# Patient Record
Sex: Male | Born: 1960 | Race: White | Hispanic: No | Marital: Married | Smoking: Never smoker
Health system: Southern US, Community
[De-identification: ages and names within clinical notes are randomized; demographics above are authoritative.]

## PROBLEM LIST (undated history)

## (undated) DIAGNOSIS — N200 Calculus of kidney: Secondary | ICD-10-CM

## (undated) DIAGNOSIS — E785 Hyperlipidemia, unspecified: Secondary | ICD-10-CM

## (undated) DIAGNOSIS — E119 Type 2 diabetes mellitus without complications: Secondary | ICD-10-CM

## (undated) HISTORY — PX: CHOLECYSTECTOMY: SHX55

## (undated) HISTORY — PX: LITHOTRIPSY: SUR834

## (undated) HISTORY — PX: NECK SURGERY: SHX720

---

## 2000-11-12 ENCOUNTER — Encounter: Payer: Self-pay | Admitting: Orthopedic Surgery

## 2000-11-12 ENCOUNTER — Ambulatory Visit (HOSPITAL_COMMUNITY): Admission: RE | Admit: 2000-11-12 | Discharge: 2000-11-12 | Payer: Self-pay | Admitting: Orthopedic Surgery

## 2000-11-26 ENCOUNTER — Encounter: Payer: Self-pay | Admitting: Orthopedic Surgery

## 2000-11-26 ENCOUNTER — Ambulatory Visit (HOSPITAL_COMMUNITY): Admission: RE | Admit: 2000-11-26 | Discharge: 2000-11-26 | Payer: Self-pay | Admitting: Orthopedic Surgery

## 2000-12-10 ENCOUNTER — Encounter: Payer: Self-pay | Admitting: Orthopedic Surgery

## 2000-12-10 ENCOUNTER — Ambulatory Visit (HOSPITAL_COMMUNITY): Admission: RE | Admit: 2000-12-10 | Discharge: 2000-12-10 | Payer: Self-pay | Admitting: Orthopedic Surgery

## 2010-11-30 ENCOUNTER — Other Ambulatory Visit: Payer: Self-pay | Admitting: Internal Medicine

## 2010-11-30 DIAGNOSIS — R109 Unspecified abdominal pain: Secondary | ICD-10-CM

## 2012-10-23 ENCOUNTER — Ambulatory Visit
Admission: RE | Admit: 2012-10-23 | Discharge: 2012-10-23 | Disposition: A | Discharge status: Home / Self Care | Payer: 59 | Source: Ambulatory Visit | Attending: Geriatric Medicine | Admitting: Geriatric Medicine

## 2012-10-23 ENCOUNTER — Other Ambulatory Visit: Payer: Self-pay | Admitting: Geriatric Medicine

## 2012-10-23 DIAGNOSIS — R109 Unspecified abdominal pain: Secondary | ICD-10-CM

## 2014-06-05 ENCOUNTER — Emergency Department (HOSPITAL_COMMUNITY): Payer: 59

## 2014-06-05 ENCOUNTER — Emergency Department (HOSPITAL_COMMUNITY)
Admission: EM | Admit: 2014-06-05 | Discharge: 2014-06-06 | Disposition: A | Discharge status: Home / Self Care | Payer: 59 | Attending: Emergency Medicine | Admitting: Emergency Medicine

## 2014-06-05 ENCOUNTER — Encounter (HOSPITAL_COMMUNITY): Payer: Self-pay | Admitting: Emergency Medicine

## 2014-06-05 DIAGNOSIS — N1330 Unspecified hydronephrosis: Secondary | ICD-10-CM | POA: Insufficient documentation

## 2014-06-05 DIAGNOSIS — R109 Unspecified abdominal pain: Secondary | ICD-10-CM | POA: Diagnosis present

## 2014-06-05 DIAGNOSIS — N200 Calculus of kidney: Secondary | ICD-10-CM | POA: Insufficient documentation

## 2014-06-05 DIAGNOSIS — F17200 Nicotine dependence, unspecified, uncomplicated: Secondary | ICD-10-CM | POA: Insufficient documentation

## 2014-06-05 DIAGNOSIS — E119 Type 2 diabetes mellitus without complications: Secondary | ICD-10-CM | POA: Insufficient documentation

## 2014-06-05 DIAGNOSIS — Z880 Allergy status to penicillin: Secondary | ICD-10-CM | POA: Insufficient documentation

## 2014-06-05 HISTORY — DX: Type 2 diabetes mellitus without complications: E11.9

## 2014-06-05 HISTORY — DX: Calculus of kidney: N20.0

## 2014-06-05 LAB — BASIC METABOLIC PANEL
Anion gap: 19 — ABNORMAL HIGH (ref 5–15)
BUN: 17 mg/dL (ref 6–23)
CO2: 16 mEq/L — ABNORMAL LOW (ref 19–32)
CREATININE: 1.52 mg/dL — AB (ref 0.50–1.35)
Calcium: 8.9 mg/dL (ref 8.4–10.5)
Chloride: 104 mEq/L (ref 96–112)
GFR calc Af Amer: 59 mL/min — ABNORMAL LOW (ref >90)
GFR calc non Af Amer: 51 mL/min — ABNORMAL LOW (ref >90)
GLUCOSE: 294 mg/dL — AB (ref 70–99)
POTASSIUM: 4.3 meq/L (ref 3.7–5.3)
Sodium: 139 mEq/L (ref 137–147)

## 2014-06-05 LAB — URINE MICROSCOPIC-ADD ON

## 2014-06-05 LAB — CBC WITH DIFFERENTIAL/PLATELET
Basophils Absolute: 0 10*3/uL (ref 0.0–0.1)
Basophils Relative: 0 % (ref 0–1)
EOS ABS: 0.1 10*3/uL (ref 0.0–0.7)
EOS PCT: 1 % (ref 0–5)
HCT: 41.4 % (ref 39.0–52.0)
HEMOGLOBIN: 14 g/dL (ref 13.0–17.0)
Lymphocytes Relative: 22 % (ref 12–46)
Lymphs Abs: 1.8 10*3/uL (ref 0.7–4.0)
MCH: 29.4 pg (ref 26.0–34.0)
MCHC: 33.8 g/dL (ref 30.0–36.0)
MCV: 86.8 fL (ref 78.0–100.0)
MONO ABS: 0.7 10*3/uL (ref 0.1–1.0)
MONOS PCT: 8 % (ref 3–12)
NEUTROS PCT: 69 % (ref 43–77)
Neutro Abs: 5.7 10*3/uL (ref 1.7–7.7)
Platelets: 217 10*3/uL (ref 150–400)
RBC: 4.77 MIL/uL (ref 4.22–5.81)
RDW: 13 % (ref 11.5–15.5)
WBC: 8.3 10*3/uL (ref 4.0–10.5)

## 2014-06-05 LAB — URINALYSIS, ROUTINE W REFLEX MICROSCOPIC
Bilirubin Urine: NEGATIVE
Glucose, UA: >1000 mg/dL — AB
Ketones, ur: NEGATIVE mg/dL
Leukocytes, UA: NEGATIVE
Nitrite: NEGATIVE
Protein, ur: NEGATIVE mg/dL
Specific Gravity, Urine: 1.027 (ref 1.005–1.030)
Urobilinogen, UA: 0.2 mg/dL (ref 0.0–1.0)
pH: 5 (ref 5.0–8.0)

## 2014-06-05 MED ORDER — ONDANSETRON HCL 4 MG/2ML IJ SOLN
4.0000 mg | Freq: Once | INTRAMUSCULAR | Status: AC
Start: 2014-06-05 — End: 2014-06-05
  Administered 2014-06-05: 4 mg via INTRAVENOUS
  Filled 2014-06-05: qty 2

## 2014-06-05 MED ORDER — HYDROMORPHONE HCL PF 1 MG/ML IJ SOLN
1.0000 mg | Freq: Once | INTRAMUSCULAR | Status: AC
  Administered 2014-06-05: 1 mg via INTRAVENOUS
  Filled 2014-06-05: qty 1

## 2014-06-05 MED ORDER — MORPHINE SULFATE 4 MG/ML IJ SOLN
4.0000 mg | Freq: Once | INTRAMUSCULAR | Status: AC
  Administered 2014-06-05: 4 mg via INTRAVENOUS
  Filled 2014-06-05: qty 1

## 2014-06-05 MED ORDER — HYDROMORPHONE HCL PF 1 MG/ML IJ SOLN: 1.0000 mg | Freq: Once | INTRAMUSCULAR | Status: DC

## 2014-06-05 MED ORDER — SODIUM CHLORIDE 0.9 % IV BOLUS (SEPSIS)
1000.0000 mL | Freq: Once | INTRAVENOUS | Status: AC
  Administered 2014-06-05: 1000 mL via INTRAVENOUS

## 2014-06-05 MED ORDER — KETOROLAC TROMETHAMINE 30 MG/ML IJ SOLN
30.0000 mg | Freq: Once | INTRAMUSCULAR | Status: AC
  Administered 2014-06-05: 30 mg via INTRAVENOUS
  Filled 2014-06-05: qty 1

## 2014-06-05 MED ORDER — PROMETHAZINE HCL 25 MG/ML IJ SOLN
25.0000 mg | Freq: Once | INTRAMUSCULAR | Status: AC
  Administered 2014-06-05: 25 mg via INTRAVENOUS
  Filled 2014-06-05: qty 1

## 2014-06-05 NOTE — ED Notes (Signed)
Patient presents to ED via [facility]. Patient states that he started having left sided flank pain a couple hours ago with associated nausea and vomiting. Patient was attempting to drive to the hospital but the pain became too severe and he flagged down an officer/ems. EMS administered of Fentanyl. VSS. Hx of kidney stones. A&Ox4. No acute distress noted at this time. 18 LAC inserted by EMS.

## 2014-06-05 NOTE — ED Provider Notes (Signed)
CSN: [id number]     Arrival date & time 06/05/14  2100 History   First MD Initiated Contact with Patient 06/05/14 2103     Chief Complaint  Patient presents with  . Flank Pain     (Consider location/radiation/quality/duration/timing/severity/associated sxs/prior Treatment) HPI Comments: Patient is a 53 year old male past history diabetes, renal calculi presenting today with the chief complaint of left flank pain for several hours.  The patient reports abrupt onset, nonradiating pain. He reports associated nausea with vomiting.  Denies diarrhea. Denies history of diverticulitis. Denies hematuria or dysuria. Patient reports 2 lithotripsies in the past and oriented. Is not followed by urologist at this time. PCP: Polite  Patient is a 53 y.o. male presenting with flank pain. The history is provided by the patient. No language interpreter was used.  Flank Pain Associated symptoms include nausea and vomiting. Pertinent negatives include no chills or fever.    Past Medical History  Diagnosis Date  . Diabetes mellitus without complication   . Kidney stones    Past Surgical History  Procedure Laterality Date  . Neck surgery    . Cholecystectomy    . Lithotripsy     No family history on file. History  Substance Use Topics  . Smoking status: Current Some Day Smoker  . Smokeless tobacco: Not on file  . Alcohol Use: Yes    Review of Systems  Constitutional: Negative for fever and chills.  Gastrointestinal: Positive for nausea and vomiting. Negative for diarrhea.  Genitourinary: Positive for flank pain. Negative for dysuria and hematuria.      Allergies  Penicillins  Home Medications   Prior to Admission medications   Not on File   BP 152/96  Pulse 83  Temp(Src) 97.9 F (36.6 C) (Oral)  Resp 20  SpO2 95% Physical Exam  Nursing note and vitals reviewed. Constitutional: He is oriented to person, place, and time. He appears well-developed and well-nourished.  Appears  uncomfortable  HENT:  Head: Normocephalic and atraumatic.  Eyes: EOM are normal. Pupils are equal, round, and reactive to light. No scleral icterus.  Neck: Neck supple.  Cardiovascular: Normal rate.   Pulmonary/Chest: Effort normal and breath sounds normal. No respiratory distress. He has no wheezes.  Abdominal: Soft. He exhibits no distension. There is no tenderness. There is CVA tenderness. There is no rebound.  Mild-moderate CVA tenderness.  Musculoskeletal: Normal range of motion.  Neurological: He is alert and oriented to person, place, and time.  Skin: Skin is warm. He is diaphoretic.  Psychiatric: He has a normal mood and affect. His behavior is normal.    ED Course  Procedures (including critical care time) Labs Review Results for orders placed during the hospital encounter of 06/05/14  URINALYSIS, ROUTINE W REFLEX MICROSCOPIC      Result Value Ref Range   Color, Urine YELLOW  YELLOW   APPearance TURBID (*) CLEAR   Specific Gravity, Urine 1.027  1.005 - 1.030   pH 5.0  5.0 - 8.0   Glucose, UA >1000 (*) NEGATIVE mg/dL   Hgb urine dipstick MODERATE (*) NEGATIVE   Bilirubin Urine NEGATIVE  NEGATIVE   Ketones, ur NEGATIVE  NEGATIVE mg/dL   Protein, ur NEGATIVE  NEGATIVE mg/dL   Urobilinogen, UA 0.2  0.0 - 1.0 mg/dL   Nitrite NEGATIVE  NEGATIVE   Leukocytes, UA NEGATIVE  NEGATIVE  CBC WITH DIFFERENTIAL      Result Value Ref Range   WBC 8.3  4.0 - 10.5 K/uL   RBC 4.77  4.22 - 5.81 MIL/uL   Hemoglobin 14.0  13.0 - 17.0 g/dL   HCT 69.6  29.5 - 28.4 %   MCV 86.8  78.0 - 100.0 fL   MCH 29.4  26.0 - 34.0 pg   MCHC 33.8  30.0 - 36.0 g/dL   RDW 13.2  44.0 - 10.2 %   Platelets 217  150 - 400 K/uL   Neutrophils Relative % 69  43 - 77 %   Neutro Abs 5.7  1.7 - 7.7 K/uL   Lymphocytes Relative 22  12 - 46 %   Lymphs Abs 1.8  0.7 - 4.0 K/uL   Monocytes Relative 8  3 - 12 %   Monocytes Absolute 0.7  0.1 - 1.0 K/uL   Eosinophils Relative 1  0 - 5 %   Eosinophils Absolute 0.1   0.0 - 0.7 K/uL   Basophils Relative 0  0 - 1 %   Basophils Absolute 0.0  0.0 - 0.1 K/uL  BASIC METABOLIC PANEL      Result Value Ref Range   Sodium 139  137 - 147 mEq/L   Potassium 4.3  3.7 - 5.3 mEq/L   Chloride 104  96 - 112 mEq/L   CO2 16 (*) 19 - 32 mEq/L   Glucose, Bld 294 (*) 70 - 99 mg/dL   BUN 17  6 - 23 mg/dL   Creatinine, Ser 7.25 (*) 0.50 - 1.35 mg/dL   Calcium 8.9  8.4 - 36.6 mg/dL   GFR calc non Af Amer 51 (*) >90 mL/min   GFR calc Af Amer 59 (*) >90 mL/min   Anion gap 19 (*) 5 - 15  URINE MICROSCOPIC-ADD ON      Result Value Ref Range   RBC / HPF 3-6  <3 RBC/hpf   Ct Renal Stone Study  06/05/2014   CLINICAL DATA:  Left flank pain began earlier this evening with associated nausea and vomiting  EXAM: CT RENAL STONE PROTOCOL  TECHNIQUE: Multidetector CT imaging of the abdomen and pelvis was performed following the standard protocol without intravenous contrast  COMPARISON:  None.  FINDINGS: Mild bilateral dependent atelectasis.  Liver is normal. Gallbladder is normal. Spleen is normal. Pancreas is normal. Adrenal glands are normal.  6 mm stone midpole right kidney. 1 mm stone lower pole right kidney. Right kidney otherwise normal.  4 mm stone midpole left kidney. 1 mm stone lower pole left kidney. Just beyond the ureterovesical junction in the proximal left ureter, there is an 8 mm stone. There is moderate proximal hydronephrosis and perinephric inflammatory change.  Bladder is normal. Reproductive organs are normal. There is moderate diverticulosis of the descending colon and of the proximal sigmoid colon. There is mild diverticulosis of the proximal colon. Large bowel is otherwise normal. Appendix is normal. Small bowel is normal. Stomach is normal.  There is no ascites. There is no significant adenopathy. There are no acute musculoskeletal findings.  IMPRESSION: Moderately severe hydronephrosis due to 8 mm stone proximal left ureter just beyond the ureteropelvic junction    Electronically Signed   By: Esperanza Heir M.D.   On: 06/05/2014 22:42     Imaging Review No results found.   EKG Interpretation None      MDM   Final diagnoses:  Renal calculi  Hydronephrosis of left kidney   Patient presents with left flank pain, history of renal calculi. Likely stone. Nausea meds, pain meds, labs, CT ordered. Creatinine elevated 1.52, fluids given. CT shows 8 mm  stone proximal ureter with moderate to severe hydronephrosis. 2305 Re-eval patient and persistent pain, continues to appear uncomfortable with active vomiting in ED. Plan to consult urology for further recommendations. Discussed patient history, condition with Dr. Laverle PatterBorden, advises Toradol, if symptoms resolve followup outpatient.  0025 reevaluation patient sleeping in room. Denies further emesis or pain at this time. States it feels as though pain to be trouble controlled at home.  Advised patient to followup with urology next week for further treatment of stone. Discussed lab results, imaging results, and treatment plan with the patient. Return precautions given. Reports understanding and no other concerns at this time.  Patient is stable for discharge at this time.  Meds given in ED:  Medications  HYDROmorphone (DILAUDID) injection 1 mg (not administered)  promethazine (PHENERGAN) injection 25 mg (not administered)  sodium chloride 0.9 % bolus 1,000 mL (0 mLs Intravenous Stopped 06/05/14 2257)  ondansetron (ZOFRAN) injection 4 mg (4 mg Intravenous Given 06/05/14 2118)  morphine 4 MG/ML injection 4 mg (4 mg Intravenous Given 06/05/14 2119)  HYDROmorphone (DILAUDID) injection 1 mg (1 mg Intravenous Given 06/05/14 2152)  HYDROmorphone (DILAUDID) injection 1 mg (1 mg Intravenous Given 06/05/14 2256)    Discharge Medication List as of 06/06/2014 12:31 AM    START taking these medications   Details  ondansetron (ZOFRAN) 4 MG tablet Take 1 tablet (4 mg total) by mouth every 6 (six) hours., Starting 06/06/2014, Until  Discontinued, Print    oxyCODONE-acetaminophen (PERCOCET/ROXICET) 5-325 MG per tablet Take 1-2 tablets by mouth every 4 (four) hours as needed for moderate pain or severe pain., Starting 06/06/2014, Until Discontinued, Print    promethazine (PHENERGAN) 25 MG suppository Place 1 suppository (25 mg total) rectally every 6 (six) hours as needed for nausea or vomiting., Starting 06/06/2014, Until Discontinued, Print    tamsulosin (FLOMAX) 0.4 MG CAPS capsule Take 1 capsule (0.4 mg total) by mouth 1 day or 1 dose., Starting 06/06/2014, Until Discontinued, Print          Mellody DrownLauren Kelechi Orgeron, PA-C 06/06/14 0124

## 2014-06-06 MED ORDER — PROMETHAZINE HCL 25 MG RE SUPP: 25.0000 mg | Freq: Four times a day (QID) | RECTAL | Status: DC | PRN

## 2014-06-06 MED ORDER — OXYCODONE-ACETAMINOPHEN 5-325 MG PO TABS: 1.0000 | ORAL_TABLET | ORAL | Status: DC | PRN

## 2014-06-06 MED ORDER — ONDANSETRON HCL 4 MG PO TABS: 4.0000 mg | ORAL_TABLET | Freq: Four times a day (QID) | ORAL | Status: DC

## 2014-06-06 MED ORDER — TAMSULOSIN HCL 0.4 MG PO CAPS: 0.4000 mg | ORAL_CAPSULE | ORAL | Status: DC

## 2014-06-06 NOTE — Discharge Instructions (Signed)
Call a urologist for further evaluation and management of your kidney stone. Call for a follow up appointment with a Family or Primary Care Provider.  Return if Symptoms worsen.   Take medication as prescribed.  Drink plenty of fluids.

## 2014-06-06 NOTE — ED Provider Notes (Signed)
Medical screening examination/treatment/procedure(s) were conducted as a shared visit with non-physician practitioner(s) and myself.  I personally evaluated the patient during the encounter.   EKG Interpretation None      I interviewed and examined the patient. Lungs are CTAB. Cardiac exam wnl. Abdomen soft.  Pt uncomfortable on my exam. Found to have 8mm stone. Initially had trouble controlling pain. On call URO recommended toradol. Pt's pain controlled. Will be d/c home and f/u w/ URO. Return for any worsening.   Logan SheffieldForrest Audria Takeshita, MD 06/06/14 1222

## 2014-06-08 ENCOUNTER — Other Ambulatory Visit: Payer: Self-pay | Admitting: Urology

## 2014-06-08 ENCOUNTER — Encounter (HOSPITAL_COMMUNITY): Payer: Self-pay | Admitting: General Practice

## 2014-06-09 ENCOUNTER — Encounter (HOSPITAL_COMMUNITY): Payer: Self-pay | Admitting: Pharmacy Technician

## 2014-06-09 NOTE — H&P (Signed)
Active Problems Problems  1. Increased urinary frequency (788.41) 2. Nephrolithiasis (592.0)  History of Present Illness Logan Gutierrez is a 53 yo former patient of Dr. Sherron Monday with a history of stones who had the onset Saturday of severe left flank pain with nausea and vomiting. He was seen in the ER where he was found to have an 8mm left proximal stone and bilateral renal stones. He has been on oxycodone q6hrs with some pain relief. He has no voiding complaints. He has had no hematuria. He has had 2 prior ESWL's [place].   Past Medical History Problems  1. History of diabetes mellitus (V12.29) 2. History of hypercholesterolemia (V12.29) 3. History of hypothyroidism (V12.29) 4. History of kidney stones (V13.01) 5. History of low back pain (V13.59) 6. History of Overweight (278.02)  Surgical History Problems  1. History of Cholecystectomy 2. History of Lithotripsy 3. History of Lithotripsy 4. History of Spinal Diskectomy Cervical  Current Meds 1. Adult Aspirin Low Strength 81 MG TBDP;  Therapy: (Recorded:15Feb2012) to Recorded 2. Crestor 5 MG Oral Tablet;  Therapy: (Recorded:10Aug2015) to Recorded 3. FreeStyle Lite Test In [employer];  Therapy: 17Jul2012 to Recorded 4. HumaLOG KwikPen 100 UNIT/ML SOLN;  Therapy: 29May2012 to Recorded 5. Invokana 100 MG Oral Tablet;  Therapy: (Recorded:10Aug2015) to Recorded 6. Ondansetron HCl - 4 MG Oral Tablet;  Therapy: (Recorded:10Aug2015) to Recorded 7. Oxycodone-Acetaminophen 5-325 MG Oral Tablet;  Therapy: (Recorded:10Aug2015) to Recorded 8. Synthroid 175 MCG Oral Tablet;  Therapy: (Recorded:10Aug2015) to Recorded 9. Tamsulosin HCl - 0.4 MG Oral Capsule;  Therapy: (Recorded:10Aug2015) to Recorded  Allergies Medication  1. Penicillins  Family History Problems  1. Family history of Diabetes Mellitus (V18.0) : Mother, Sister, Brother 2. Family history of Hypertension (V17.49) : Father 3. Family history of Hypertension (V17.49) :  Mother 4. 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  Genitourinary: urinary frequency.    Vitals Vital Signs [Data Includes: Last 1 Day]  Recorded: 10Aug2015 02:25PM  Weight: 255 lb  BMI Calculated: 32.74 BSA Calculated: 2.41 Blood Pressure: 146 / 88 Temperature: 97.7 F Heart Rate: 95  Physical Exam Constitutional: Well nourished and well developed . No acute distress.  ENT:. The ears and nose are normal in appearance.  Neck: The appearance of the neck is normal and no neck mass is present.  Pulmonary: No respiratory distress and normal respiratory rhythm and effort.  Cardiovascular: Heart rate and rhythm are normal . No peripheral edema.  Abdomen: The abdomen is obese. The abdomen is soft and nontender. No masses are palpated. No CVA tenderness. No hernias are palpable. No hepatosplenomegaly noted.  Lymphatics: The posterior cervical and supraclavicular nodes are not enlarged or tender.  Skin: Normal skin turgor, no visible rash and no visible skin lesions.  Neuro/Psych:. Mood and affect are appropriate.    Results/Data Urine [Data Includes: Last 1 Day]   10Aug2015  COLOR YELLOW   APPEARANCE CLEAR   SPECIFIC GRAVITY 1.010   pH 5.5   GLUCOSE >  1000 mg/dL  BILIRUBIN NEG   KETONE 15 mg/dL  BLOOD TRACE   PROTEIN NEG mg/dL  UROBILINOGEN 0.2 mg/dL  NITRITE NEG   LEUKOCYTE ESTERASE NEG   SQUAMOUS EPITHELIAL/HPF  RARE   WBC 0-2 WBC/hpf  RBC 0-2 RBC/hpf  BACTERIA NONE SEEN   CRYSTALS NONE SEEN   CASTS NONE SEEN    The following images/tracing/specimen were independently visualized:  CT films and report from 8/8 reviewed. I measure the stone in the left ureter as about 5mm. There are couple of smaller bilateral renal stones. KUB today shows the left ureteral stone between L2 and L# and it is 6.788mm across. There is about a 5mm right renal stone and a small left renal stone. He has left pelvic phleboliths but no other abnormalities.  The following clinical lab reports were reviewed:  UA reivewed.    Assessment Assessed  1. Calculus of left ureter (592.1) 2. Nephrolithiasis (592.0)  He has a 6.638mm left proximal stone and bilateral renal stones.   Plan Calculus of left ureter  1. Start: Oxycodone-Acetaminophen 5-325 MG Oral Tablet; take 1 or 2 tablets q 4-6 hours  prn pain 2. Follow-up Schedule Surgery Office  Follow-up  Status: Hold For - Appointment   Requested for: 10Aug2015 3. KUB; Status:Resulted - Requires Verification;   Done: 10Aug2015 12:00AM Health Maintenance  4. UA With REFLEX; [Do Not Release]; Status:Resulted - Requires Verification;   Done:  10Aug2015 01:59PM  I discussed observation, ESWL and ureteroscopy and believe he will be best served with ESWL since he has had a good response to that in the past and his stone is a bit large to readily pass.   I reviewed the risks of bleeding, infection, injury to the kidney and adjacent structures, need for secondary procedures and sedation risks.  Hopefully we can get him on for Thursday. He will hold his ASA.   Discussion/Summary CC: Dr. Trula Sladeon Polite.   Signatures Electronically signed by : Bjorn PippinJohn Terrell Shimko, M.D.; Jun 07 2014  3:58PM EST

## 2014-06-10 ENCOUNTER — Encounter (HOSPITAL_COMMUNITY): Payer: Self-pay | Admitting: General Practice

## 2014-06-10 ENCOUNTER — Encounter (HOSPITAL_COMMUNITY)
Admission: RE | Disposition: A | Discharge status: Home / Self Care | Payer: Self-pay | Source: Ambulatory Visit | Attending: Urology

## 2014-06-10 ENCOUNTER — Ambulatory Visit (HOSPITAL_COMMUNITY): Payer: 59

## 2014-06-10 ENCOUNTER — Ambulatory Visit (HOSPITAL_COMMUNITY)
Admission: RE | Admit: 2014-06-10 | Discharge: 2014-06-10 | Disposition: A | Discharge status: Home / Self Care | Payer: 59 | Source: Ambulatory Visit | Attending: Urology | Admitting: Urology

## 2014-06-10 DIAGNOSIS — E7800 Pure hypercholesterolemia, unspecified: Secondary | ICD-10-CM | POA: Diagnosis not present

## 2014-06-10 DIAGNOSIS — R350 Frequency of micturition: Secondary | ICD-10-CM | POA: Diagnosis not present

## 2014-06-10 DIAGNOSIS — E119 Type 2 diabetes mellitus without complications: Secondary | ICD-10-CM | POA: Insufficient documentation

## 2014-06-10 DIAGNOSIS — N201 Calculus of ureter: Secondary | ICD-10-CM | POA: Insufficient documentation

## 2014-06-10 DIAGNOSIS — Z6832 Body mass index (BMI) 32.0-32.9, adult: Secondary | ICD-10-CM | POA: Diagnosis not present

## 2014-06-10 DIAGNOSIS — Z880 Allergy status to penicillin: Secondary | ICD-10-CM | POA: Diagnosis not present

## 2014-06-10 DIAGNOSIS — E039 Hypothyroidism, unspecified: Secondary | ICD-10-CM | POA: Insufficient documentation

## 2014-06-10 DIAGNOSIS — E663 Overweight: Secondary | ICD-10-CM | POA: Diagnosis not present

## 2014-06-10 HISTORY — DX: Hyperlipidemia, unspecified: E78.5

## 2014-06-10 LAB — GLUCOSE, CAPILLARY: GLUCOSE-CAPILLARY: 209 mg/dL — AB (ref 70–99)

## 2014-06-10 SURGERY — LITHOTRIPSY, ESWL
Anesthesia: LOCAL

## 2014-06-10 MED ORDER — DIAZEPAM 5 MG PO TABS
10.0000 mg | ORAL_TABLET | ORAL | Status: AC
  Administered 2014-06-10: 10 mg via ORAL
  Filled 2014-06-10: qty 2

## 2014-06-10 MED ORDER — OXYCODONE-ACETAMINOPHEN 5-325 MG PO TABS: 1.0000 | ORAL_TABLET | ORAL | Status: DC | PRN

## 2014-06-10 MED ORDER — ACETAMINOPHEN 650 MG RE SUPP: 650.0000 mg | RECTAL | Status: DC | PRN

## 2014-06-10 MED ORDER — CIPROFLOXACIN HCL 500 MG PO TABS
500.0000 mg | ORAL_TABLET | ORAL | Status: AC
  Administered 2014-06-10: 500 mg via ORAL
  Filled 2014-06-10: qty 1

## 2014-06-10 MED ORDER — SODIUM CHLORIDE 0.9 % IV SOLN
250.0000 mL | INTRAVENOUS | Status: DC | PRN
Start: 2014-06-10 — End: 2014-06-10

## 2014-06-10 MED ORDER — FENTANYL CITRATE 0.05 MG/ML IJ SOLN: 25.0000 ug | INTRAMUSCULAR | Status: DC | PRN

## 2014-06-10 MED ORDER — SODIUM CHLORIDE 0.9 % IJ SOLN: 3.0000 mL | Freq: Two times a day (BID) | INTRAMUSCULAR | Status: DC

## 2014-06-10 MED ORDER — OXYCODONE HCL 5 MG PO TABS: 5.0000 mg | ORAL_TABLET | ORAL | Status: DC | PRN

## 2014-06-10 MED ORDER — ACETAMINOPHEN 325 MG PO TABS: 650.0000 mg | ORAL_TABLET | ORAL | Status: DC | PRN

## 2014-06-10 MED ORDER — SODIUM CHLORIDE 0.9 % IJ SOLN: 3.0000 mL | INTRAMUSCULAR | Status: DC | PRN

## 2014-06-10 MED ORDER — DIPHENHYDRAMINE HCL 25 MG PO CAPS
25.0000 mg | ORAL_CAPSULE | ORAL | Status: AC
Start: 2014-06-10 — End: 2014-06-10
  Administered 2014-06-10: 25 mg via ORAL
  Filled 2014-06-10: qty 1

## 2014-06-10 MED ORDER — DEXTROSE-NACL 5-0.45 % IV SOLN
INTRAVENOUS | Status: DC
  Administered 2014-06-10: 07:00:00 via INTRAVENOUS

## 2014-06-10 NOTE — Discharge Instructions (Signed)

## 2014-06-10 NOTE — Interval H&P Note (Signed)
History and Physical Interval Note:  06/10/2014 8:44 AM  Logan Gutierrez  has presented today for surgery, with the diagnosis of LEFT PROXIMAL STONE   The various methods of treatment have been discussed with the patient and family. After consideration of risks, benefits and other options for treatment, the patient has consented to  Procedure(s): LEFT EXTRACORPOREAL SHOCK WAVE LITHOTRIPSY (ESWL) (N/A) as a surgical intervention .  The patient's history has been reviewed, patient examined, no change in status, stable for surgery.  I have reviewed the patient's chart and labs.  Questions were answered to the patient's satisfaction.     Gissela Bloch J

## 2014-10-16 IMAGING — CT CT ABD-PELV W/O CM
2 of 4 series · 17 of 46 positions shown, 19 images · non-contrast
Comparison: None.

CLINICAL DATA: LLQ/flank pain, history of kidney stones, prior
lithotripsy

CT ABDOMEN AND PELVIS WITHOUT CONTRAST
TECHNIQUE: Multidetector CT imaging of the abdomen and pelvis was
performed following the standard protocol without intravenous
contrast.

[Series 2: renal stone w/o · axial · non-contrast · 0.88mm/px · z∈[-423,+52]mm · 14 of 105 slices shown, 16 images]
[im 5/105  soft-tissue]
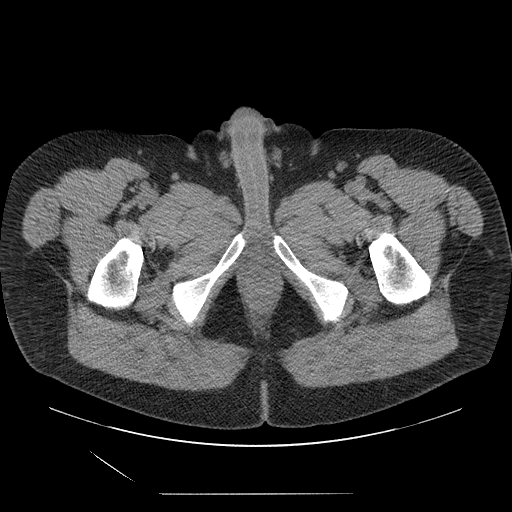
[im 5/105  bone]
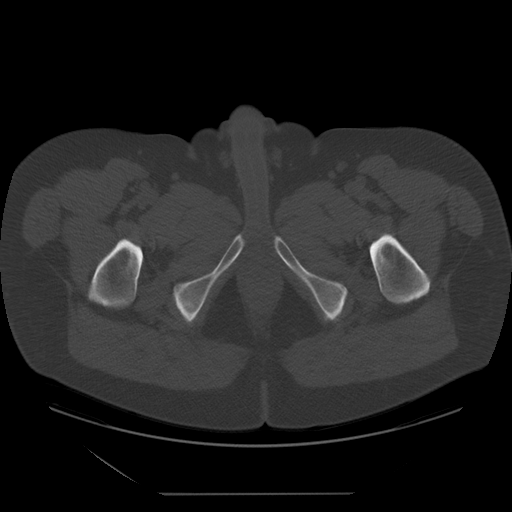
[im 14/105  soft-tissue]
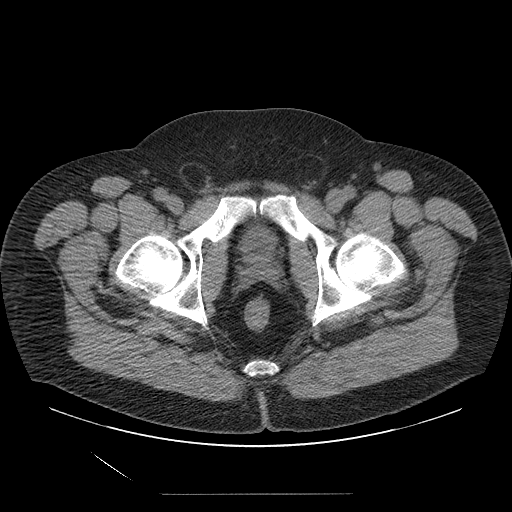
[im 22/105  soft-tissue]
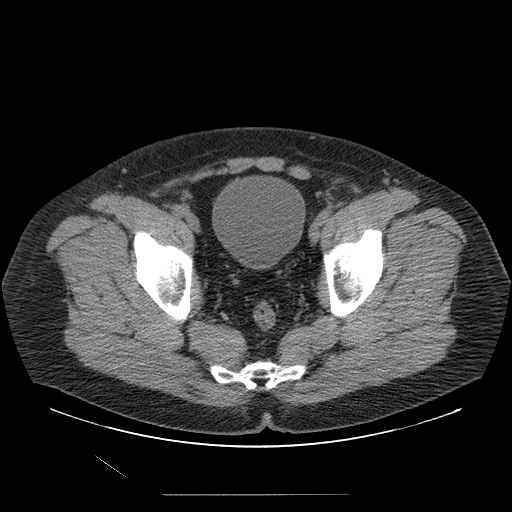
[im 27/105  soft-tissue]
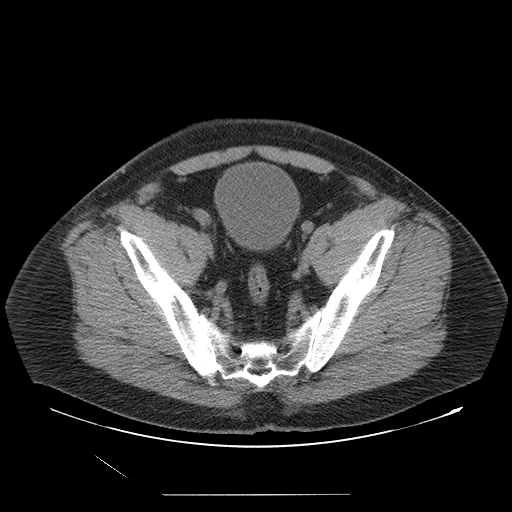
[im 35/105  soft-tissue]
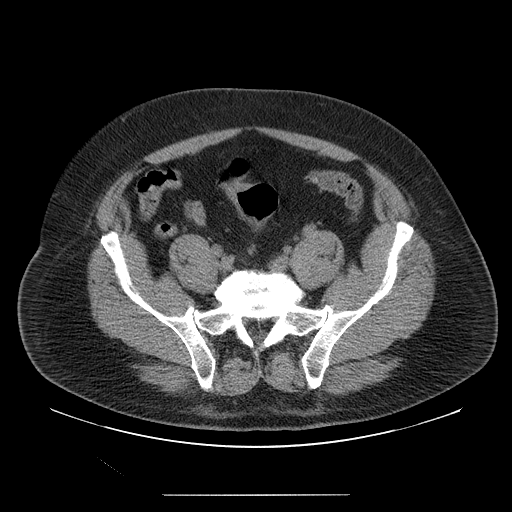
[im 44/105  soft-tissue]
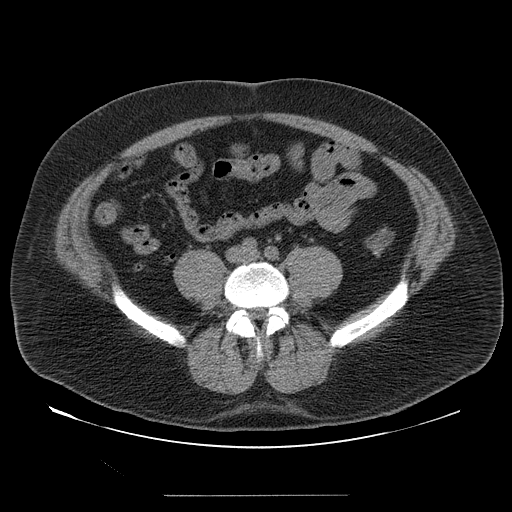
[im 48/105  soft-tissue]
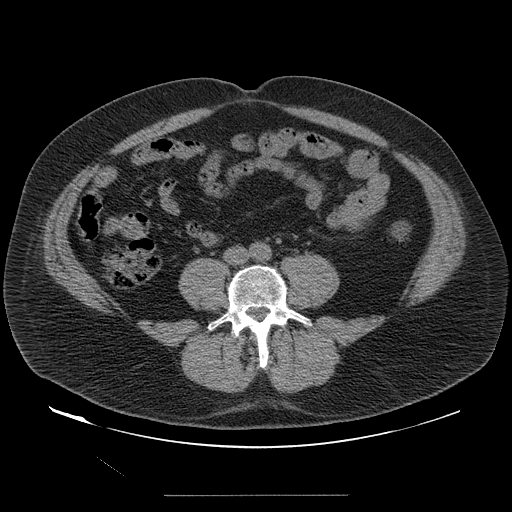
[im 57/105  soft-tissue]
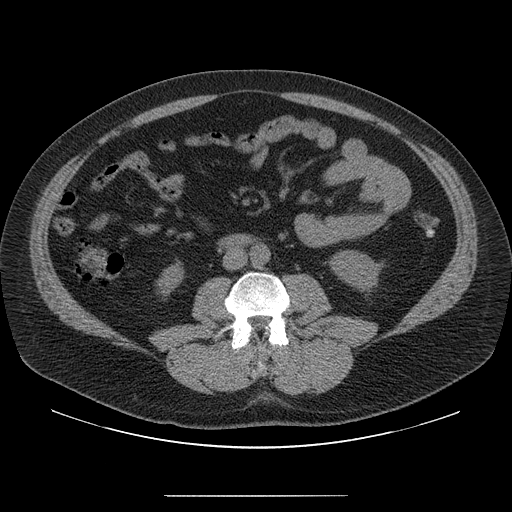
[im 61/105  soft-tissue]
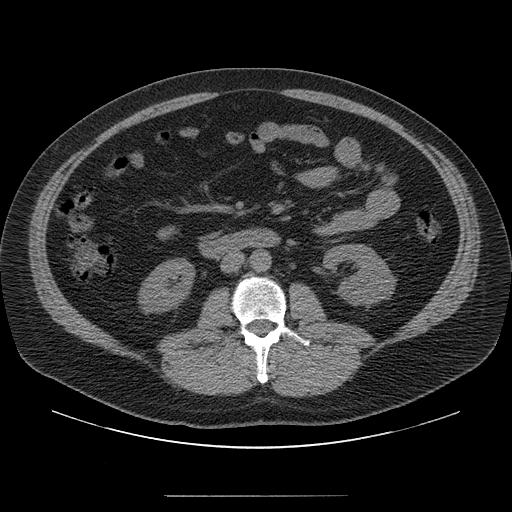
[im 61/105  bone]
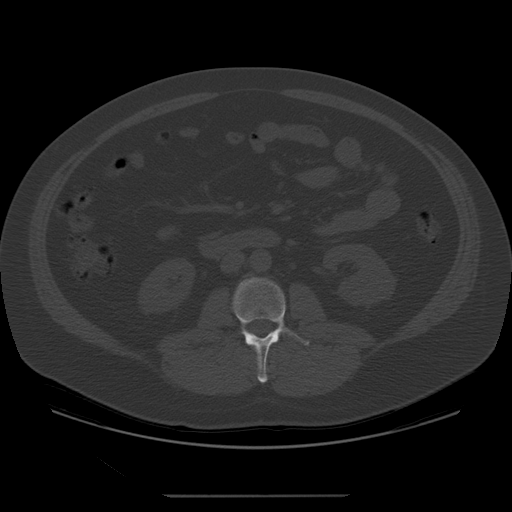
[im 70/105  soft-tissue]
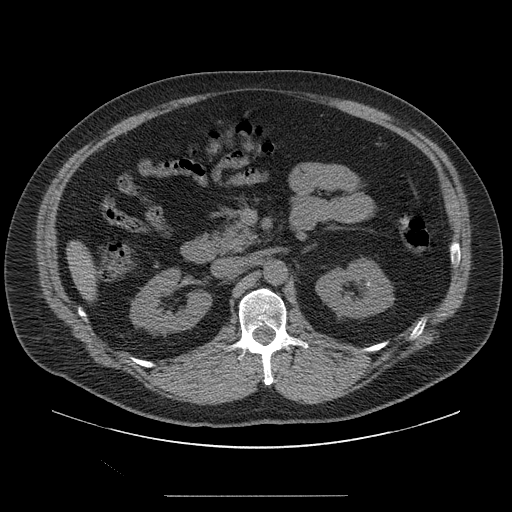
[im 79/105  soft-tissue]
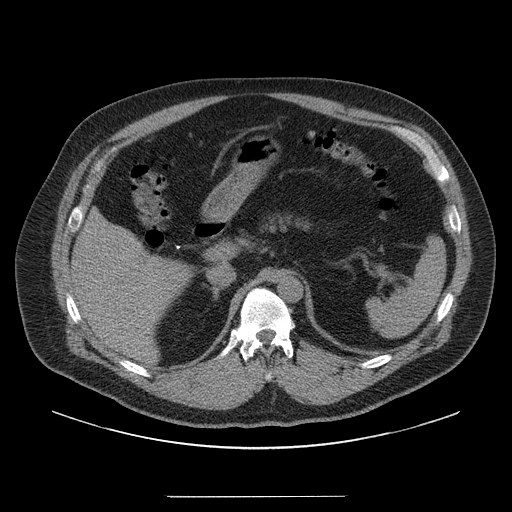
[im 83/105  soft-tissue]
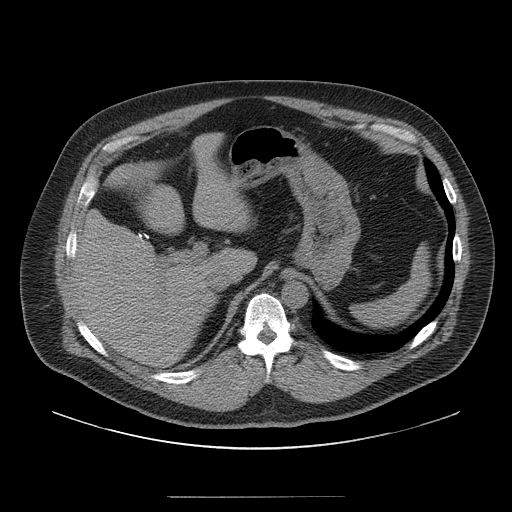
[im 92/105  soft-tissue]
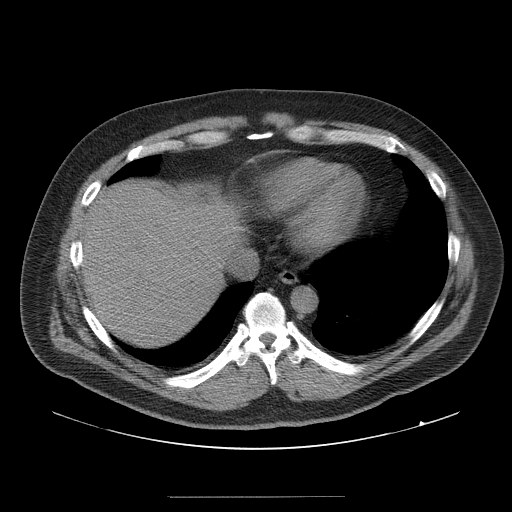
[im 100/105  soft-tissue]
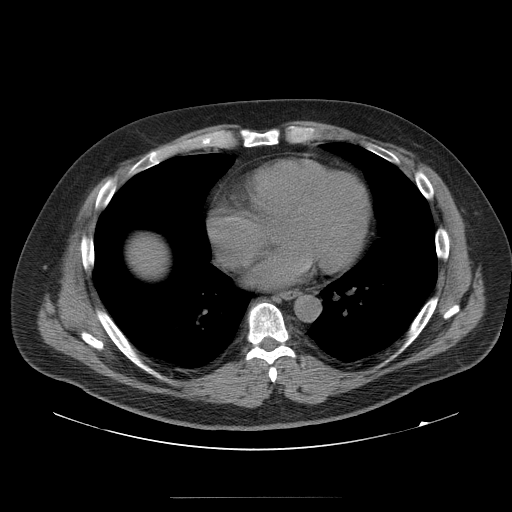

[Series 401: cor · coronal · 1.08mm/px · 3 of 131 slices shown]
[im 44/131  soft-tissue]
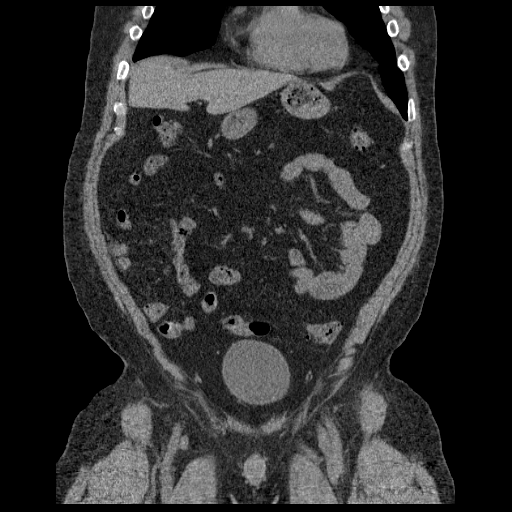
[im 58/131  soft-tissue]
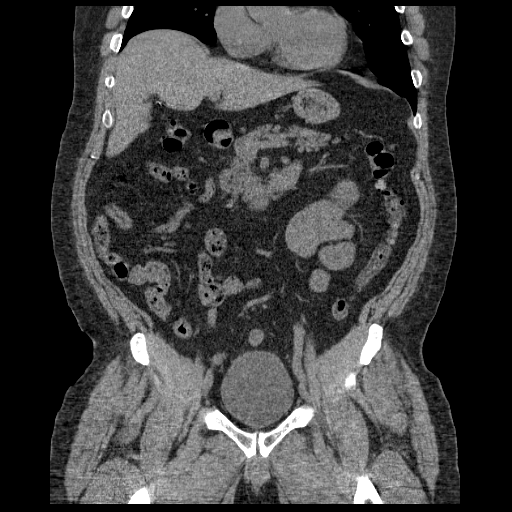
[im 73/131  soft-tissue]
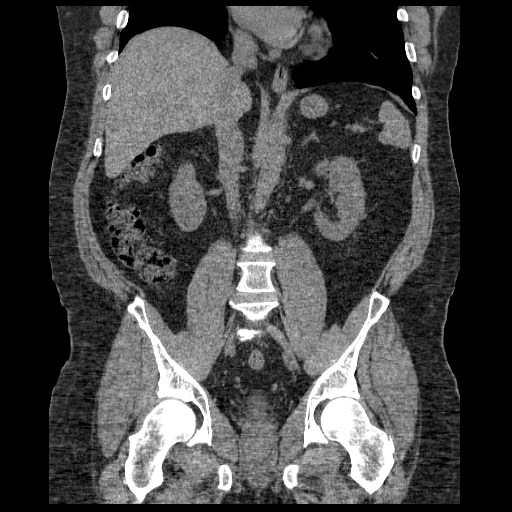

[17 of 46 positions shown; findings below may reference images not displayed]

FINDINGS: Minimal dependent atelectasis at the right lung base.

Unenhanced liver, spleen, pancreas, and adrenal glands are within
normal limits.

Status post cholecystectomy.  No intrahepatic or extrahepatic
ductal dilatation.

6 mm nonobstructing interpolar right renal calculus (series 2/image
38).  Two nonobstructing left renal calculi measuring 3-4 mm
(series 2/images 37 and 42).  No hydronephrosis.

No evidence of bowel obstruction.  Normal appendix.  Colonic
diverticulosis, without associated inflammatory changes.

No evidence of abdominal aortic aneurysm.

No abdominopelvic ascites.

No suspicious abdominopelvic lymphadenopathy.

Prostate is unremarkable.

No ureteral or bladder calculi.  Bladder is unremarkable.

Tiny fat-containing left inguinal hernia.

Mild degenerative changes of the visualized thoracolumbar spine.
IMPRESSION: Bilateral nonobstructing calculi, as described above, measuring up
to 6 mm.  No hydronephrosis.

No ureteral or bladder calculi.

## 2016-06-02 IMAGING — CR DG ABDOMEN 1V
2 series · 2 of 2 positions shown · non-contrast
Comparison: CT of the abdomen and pelvis performed 06/05/2014

CLINICAL DATA: Pre-lithotripsy radiograph. Assess left-sided
ureteral stone.

EXAM:
ABDOMEN - 1 VIEW

[t abdomen supine (1 of 2)]
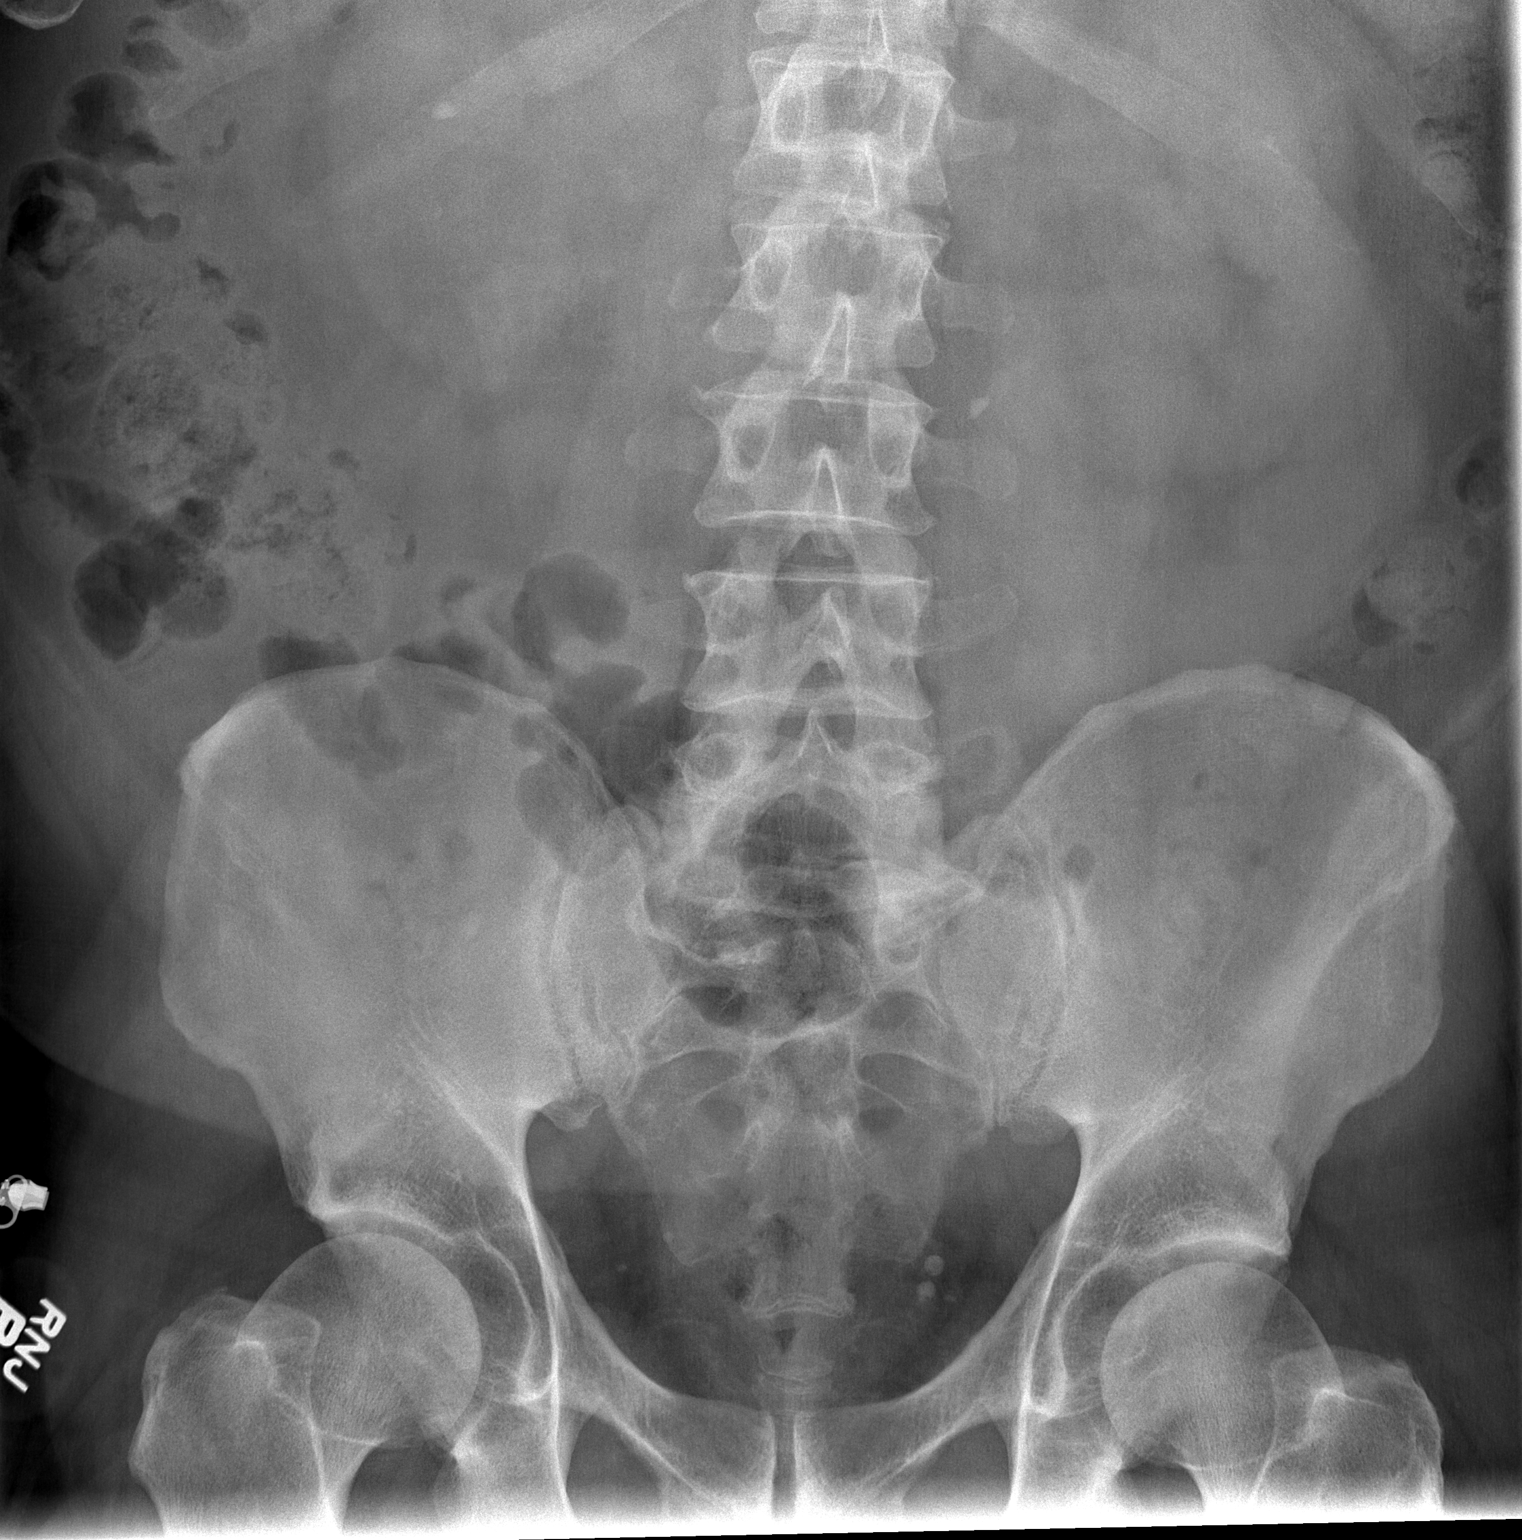

[t abdomen supine (2 of 2)]
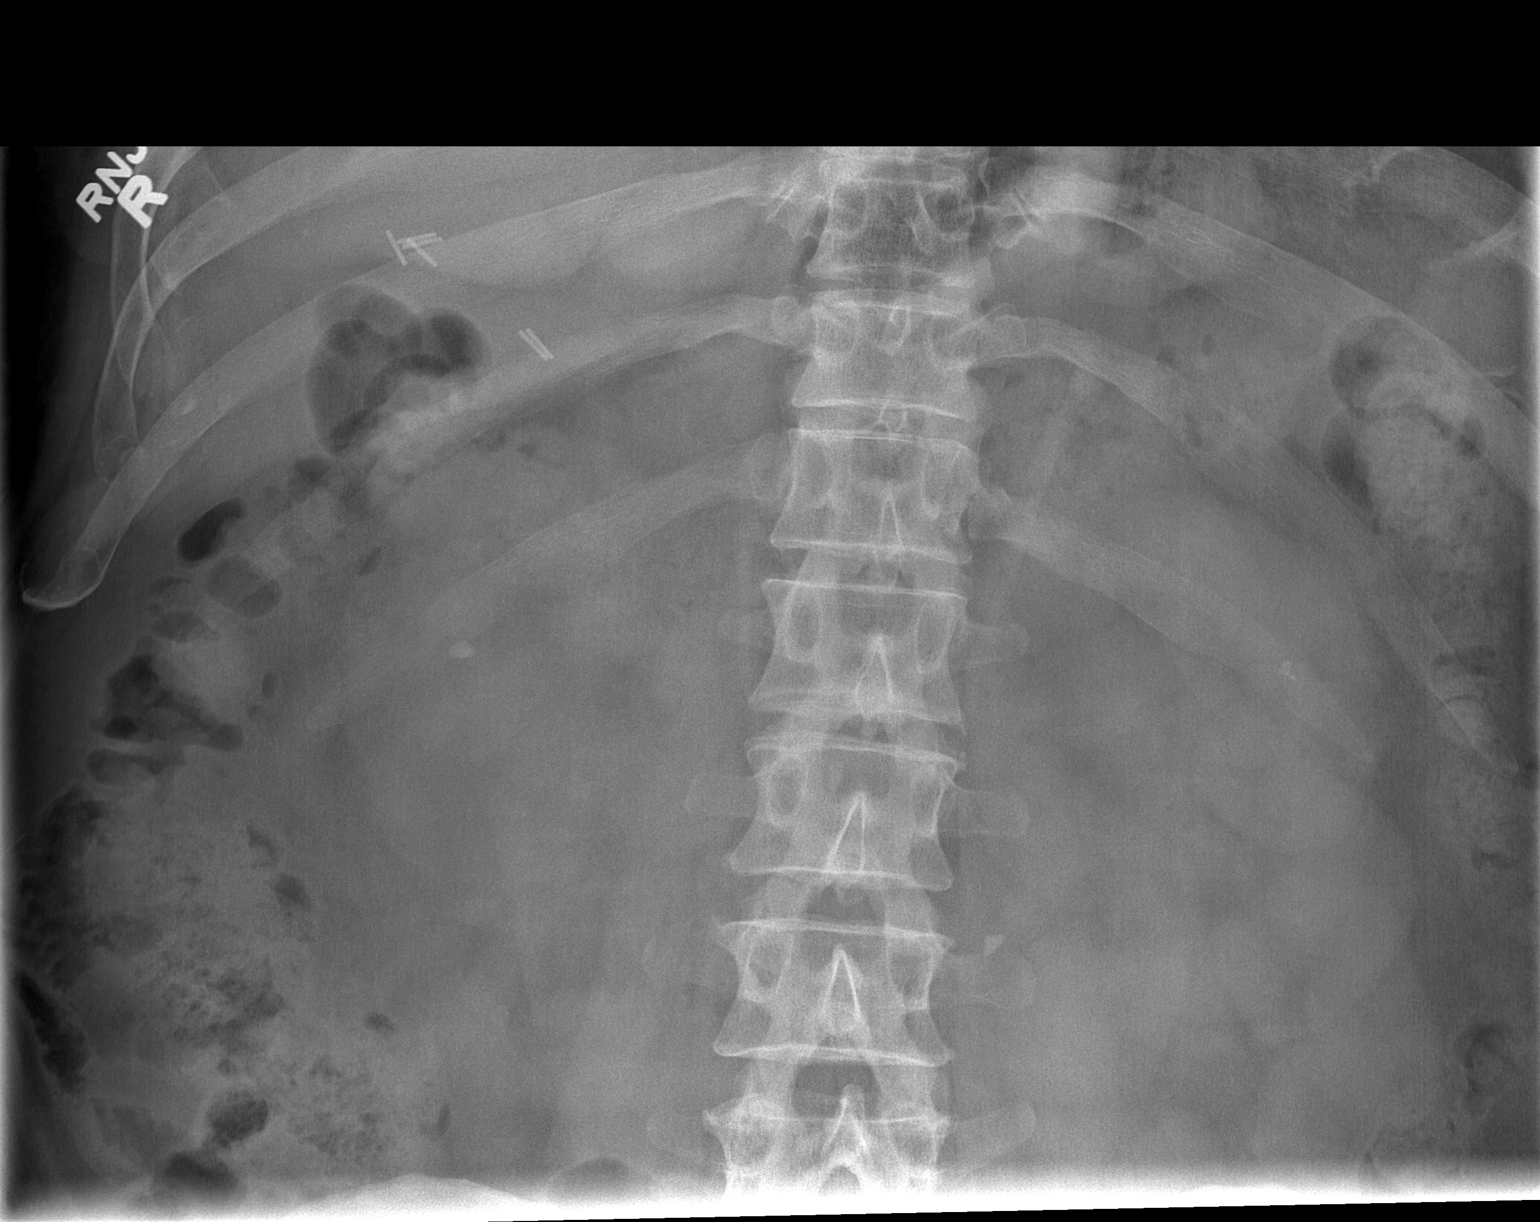

[2 of 2 positions shown; findings below may reference images not displayed]

FINDINGS: An 8 mm obstructing stone is again noted at the proximal left
ureter, in unchanged position. Known bilateral renal stones are
again seen. The visualized bowel gas pattern is grossly
unremarkable. No acute osseous abnormalities are seen. Clips are
noted within the right upper quadrant, reflecting prior
cholecystectomy.
IMPRESSION: 8 mm obstructing stone again noted at the proximal left ureter, in
unchanged position. Known bilateral renal stones again seen.

## 2016-12-31 DIAGNOSIS — E7800 Pure hypercholesterolemia, unspecified: Secondary | ICD-10-CM | POA: Diagnosis not present

## 2016-12-31 DIAGNOSIS — E1165 Type 2 diabetes mellitus with hyperglycemia: Secondary | ICD-10-CM | POA: Diagnosis not present

## 2016-12-31 DIAGNOSIS — E039 Hypothyroidism, unspecified: Secondary | ICD-10-CM | POA: Diagnosis not present

## 2017-01-28 DIAGNOSIS — E119 Type 2 diabetes mellitus without complications: Secondary | ICD-10-CM | POA: Diagnosis not present

## 2017-01-28 DIAGNOSIS — K625 Hemorrhage of anus and rectum: Secondary | ICD-10-CM | POA: Diagnosis not present

## 2017-02-22 DIAGNOSIS — E039 Hypothyroidism, unspecified: Secondary | ICD-10-CM | POA: Diagnosis not present

## 2017-02-22 DIAGNOSIS — Z0000 Encounter for general adult medical examination without abnormal findings: Secondary | ICD-10-CM | POA: Diagnosis not present

## 2017-02-22 DIAGNOSIS — Z125 Encounter for screening for malignant neoplasm of prostate: Secondary | ICD-10-CM | POA: Diagnosis not present

## 2017-02-22 DIAGNOSIS — E7800 Pure hypercholesterolemia, unspecified: Secondary | ICD-10-CM | POA: Diagnosis not present

## 2017-02-28 DIAGNOSIS — E039 Hypothyroidism, unspecified: Secondary | ICD-10-CM | POA: Diagnosis not present

## 2017-04-12 DIAGNOSIS — E039 Hypothyroidism, unspecified: Secondary | ICD-10-CM | POA: Diagnosis not present

## 2017-04-28 ENCOUNTER — Emergency Department (HOSPITAL_COMMUNITY)
Admission: EM | Admit: 2017-04-28 | Discharge: 2017-04-28 | Disposition: A | Discharge status: Home / Self Care | Payer: BLUE CROSS/BLUE SHIELD | Attending: Emergency Medicine | Admitting: Emergency Medicine

## 2017-04-28 ENCOUNTER — Encounter (HOSPITAL_COMMUNITY): Payer: Self-pay

## 2017-04-28 DIAGNOSIS — Z7982 Long term (current) use of aspirin: Secondary | ICD-10-CM | POA: Diagnosis not present

## 2017-04-28 DIAGNOSIS — H538 Other visual disturbances: Secondary | ICD-10-CM | POA: Diagnosis not present

## 2017-04-28 DIAGNOSIS — E119 Type 2 diabetes mellitus without complications: Secondary | ICD-10-CM | POA: Insufficient documentation

## 2017-04-28 DIAGNOSIS — E785 Hyperlipidemia, unspecified: Secondary | ICD-10-CM | POA: Diagnosis not present

## 2017-04-28 DIAGNOSIS — F17200 Nicotine dependence, unspecified, uncomplicated: Secondary | ICD-10-CM | POA: Insufficient documentation

## 2017-04-28 DIAGNOSIS — Z79899 Other long term (current) drug therapy: Secondary | ICD-10-CM | POA: Insufficient documentation

## 2017-04-28 DIAGNOSIS — H5462 Unqualified visual loss, left eye, normal vision right eye: Secondary | ICD-10-CM

## 2017-04-28 DIAGNOSIS — H53132 Sudden visual loss, left eye: Secondary | ICD-10-CM | POA: Diagnosis not present

## 2017-04-28 DIAGNOSIS — Z794 Long term (current) use of insulin: Secondary | ICD-10-CM | POA: Insufficient documentation

## 2017-04-28 NOTE — ED Notes (Signed)
Pt and family understood dc material and importance of follow up in am. NAD noted

## 2017-04-28 NOTE — ED Provider Notes (Signed)
[facility]-EMERGENCY DEPT Provider Note   CSN: [id number] Arrival date & time: 04/28/17  2012     History   Chief Complaint Chief Complaint  Patient presents with  . Loss of Vision    HPI Logan Gutierrez is a 56 y.o. male.  HPI Patient is a 56 year old male who reports that his vision in his left eye became abnormal this afternoon and has continued to progress him.  He feels as though there is loss of vision in the superior medial quadrant of his left eye vision.  He describes this as a white coloration.  He reports possible flashes of light yesterday but no clear floaters.  No flashes or floaters today.  He does wear contact lenses.  He took his contact lenses out and continued having the symptoms and because his symptoms somewhat progressed to continue the ER for evaluation.  No pain in the eye.  No injury or trauma.  No headache.  Denies nausea or vomiting.  Patient has never had issues like this before.  He does have diabetes.   Past Medical History:  Diagnosis Date  . Diabetes mellitus without complication (HCC)   . Hyperlipidemia   . Kidney stones     There are no active problems to display for this patient.   Past Surgical History:  Procedure Laterality Date  . CHOLECYSTECTOMY    . LITHOTRIPSY    . NECK SURGERY         Home Medications    Prior to Admission medications   Medication Sig Start Date End Date Taking? Authorizing Provider  aspirin EC 81 MG tablet Take 81 mg by mouth daily.    [provider]  Canagliflozin (INVOKANA) 100 MG TABS Take 100 mg by mouth daily.    [provider]  insulin lispro protamine-lispro (HUMALOG 50/50 MIX) (50-50) 100 UNIT/ML SUSP injection Inject 50 Units into the skin 2 (two) times daily before a meal.    [provider]  levothyroxine (SYNTHROID, LEVOTHROID) 175 MCG tablet Take 175 mcg by mouth daily before breakfast.    [provider]  Multiple Vitamin (MULTIVITAMIN WITH MINERALS) TABS tablet  Take 1 tablet by mouth daily.    [provider]  oxyCODONE-acetaminophen (PERCOCET/ROXICET) 5-325 MG per tablet Take 1-2 tablets by mouth every 4 (four) hours as needed for moderate pain or severe pain. 06/10/14   Bjorn Pippin, MD  rosuvastatin (CRESTOR) 5 MG tablet Take 5 mg by mouth daily.    [provider]  tamsulosin (FLOMAX) 0.4 MG CAPS capsule Take 0.4 mg by mouth daily.    [provider]    Family History No family history on file.  Social History Social History  Substance Use Topics  . Smoking status: Current Some Day Smoker  . Smokeless tobacco: Never Used  . Alcohol use Yes     Allergies   Penicillins   Review of Systems Review of Systems  All other systems reviewed and are negative.    Physical Exam Updated Vital Signs BP 120/76   Pulse 71   Temp 98.3 F (36.8 C) (Oral)   Resp 18   SpO2 97%   Physical Exam  Constitutional: He is oriented to person, place, and time. He appears well-developed and well-nourished.  HENT:  Head: Normocephalic.  Eyes: Lids are normal. Pupils are equal, round, and reactive to light. Left eye exhibits no chemosis, no discharge and no exudate. Left conjunctiva is not injected. Left eye exhibits normal extraocular motion and no nystagmus.  Neck: Normal range of motion.  Cardiovascular: Normal rate.   Pulmonary/Chest: Effort normal.  Abdominal: He exhibits no distension.  Musculoskeletal: Normal range of motion.  Neurological: He is alert and oriented to person, place, and time.  Skin: Skin is warm.  Nursing note and vitals reviewed.    ED Treatments / Results  Labs (all labs ordered are listed, but only abnormal results are displayed) Labs Reviewed - No data to display  EKG  EKG Interpretation None       Radiology No results found.  Procedures Procedures (including critical care time)  Medications Ordered in ED Medications - No data to display   Initial Impression / Assessment and  Plan / ED Course  I have reviewed the triage vital signs and the nursing notes.  Pertinent labs & imaging results that were available during my care of the patient were reviewed by me and considered in my medical decision making (see chart for details).     Bedside ultrasound demonstrates no obvious retinal attachment visible.  I discussed the case with ophthalmology who will see the patient first thing in the morning at 7:15am.  This could represent small retinal detachment versus vitreous detachment.  There did appear to be a small amount of vitreous hemorrhage on ocular ultrasound.    Optho: Dr Baker Pierinihris Weaver   Final Clinical Impressions(s) / ED Diagnoses   Final diagnoses:  Vision loss of left eye    New Prescriptions New Prescriptions   No medications on file     Azalia Bilisampos, Whitnie Deleon, MD 04/28/17 2314

## 2017-04-28 NOTE — ED Triage Notes (Signed)
Pt reports sudden blurry vision today at 12 noon. He states it looks like a half moon in the top right corner of his left eye and when he closes his eyes he states he sees a half moon and it is white. Pt has contact lenses in now.

## 2017-04-29 DIAGNOSIS — H43392 Other vitreous opacities, left eye: Secondary | ICD-10-CM | POA: Diagnosis not present

## 2017-04-29 DIAGNOSIS — H5213 Myopia, bilateral: Secondary | ICD-10-CM | POA: Diagnosis not present

## 2017-04-29 DIAGNOSIS — H33012 Retinal detachment with single break, left eye: Secondary | ICD-10-CM | POA: Diagnosis not present

## 2017-04-29 DIAGNOSIS — H2511 Age-related nuclear cataract, right eye: Secondary | ICD-10-CM | POA: Diagnosis not present

## 2017-04-29 DIAGNOSIS — H43812 Vitreous degeneration, left eye: Secondary | ICD-10-CM | POA: Diagnosis not present

## 2017-04-29 DIAGNOSIS — H33002 Unspecified retinal detachment with retinal break, left eye: Secondary | ICD-10-CM | POA: Diagnosis not present

## 2017-04-30 DIAGNOSIS — H33012 Retinal detachment with single break, left eye: Secondary | ICD-10-CM | POA: Diagnosis not present

## 2017-05-13 DIAGNOSIS — H33012 Retinal detachment with single break, left eye: Secondary | ICD-10-CM | POA: Diagnosis not present

## 2017-05-13 DIAGNOSIS — H3342 Traction detachment of retina, left eye: Secondary | ICD-10-CM | POA: Diagnosis not present

## 2017-05-15 DIAGNOSIS — H3342 Traction detachment of retina, left eye: Secondary | ICD-10-CM | POA: Diagnosis not present

## 2017-05-15 DIAGNOSIS — H5213 Myopia, bilateral: Secondary | ICD-10-CM | POA: Diagnosis not present

## 2017-05-17 DIAGNOSIS — H3342 Traction detachment of retina, left eye: Secondary | ICD-10-CM | POA: Diagnosis not present

## 2017-05-17 DIAGNOSIS — H33022 Retinal detachment with multiple breaks, left eye: Secondary | ICD-10-CM | POA: Diagnosis not present

## 2017-06-19 DIAGNOSIS — H33022 Retinal detachment with multiple breaks, left eye: Secondary | ICD-10-CM | POA: Diagnosis not present

## 2017-06-25 DIAGNOSIS — H25013 Cortical age-related cataract, bilateral: Secondary | ICD-10-CM | POA: Diagnosis not present

## 2017-06-25 DIAGNOSIS — H5213 Myopia, bilateral: Secondary | ICD-10-CM | POA: Diagnosis not present

## 2017-06-25 DIAGNOSIS — H2513 Age-related nuclear cataract, bilateral: Secondary | ICD-10-CM | POA: Diagnosis not present

## 2017-06-25 DIAGNOSIS — E119 Type 2 diabetes mellitus without complications: Secondary | ICD-10-CM | POA: Diagnosis not present

## 2017-07-17 DIAGNOSIS — E1165 Type 2 diabetes mellitus with hyperglycemia: Secondary | ICD-10-CM | POA: Diagnosis not present

## 2017-07-17 DIAGNOSIS — E7800 Pure hypercholesterolemia, unspecified: Secondary | ICD-10-CM | POA: Diagnosis not present

## 2017-07-17 DIAGNOSIS — E039 Hypothyroidism, unspecified: Secondary | ICD-10-CM | POA: Diagnosis not present

## 2017-07-23 DIAGNOSIS — H2513 Age-related nuclear cataract, bilateral: Secondary | ICD-10-CM | POA: Diagnosis not present

## 2017-07-23 DIAGNOSIS — H25042 Posterior subcapsular polar age-related cataract, left eye: Secondary | ICD-10-CM | POA: Diagnosis not present

## 2017-07-23 DIAGNOSIS — H5213 Myopia, bilateral: Secondary | ICD-10-CM | POA: Diagnosis not present

## 2017-07-23 DIAGNOSIS — H25013 Cortical age-related cataract, bilateral: Secondary | ICD-10-CM | POA: Diagnosis not present

## 2017-08-01 DIAGNOSIS — H33022 Retinal detachment with multiple breaks, left eye: Secondary | ICD-10-CM | POA: Diagnosis not present

## 2017-08-01 DIAGNOSIS — H3342 Traction detachment of retina, left eye: Secondary | ICD-10-CM | POA: Diagnosis not present

## 2017-08-14 DIAGNOSIS — H25812 Combined forms of age-related cataract, left eye: Secondary | ICD-10-CM | POA: Diagnosis not present

## 2017-08-14 DIAGNOSIS — H2181 Floppy iris syndrome: Secondary | ICD-10-CM | POA: Diagnosis not present

## 2017-08-14 DIAGNOSIS — H2512 Age-related nuclear cataract, left eye: Secondary | ICD-10-CM | POA: Diagnosis not present

## 2017-12-04 DIAGNOSIS — E7800 Pure hypercholesterolemia, unspecified: Secondary | ICD-10-CM | POA: Diagnosis not present

## 2017-12-04 DIAGNOSIS — E039 Hypothyroidism, unspecified: Secondary | ICD-10-CM | POA: Diagnosis not present

## 2017-12-04 DIAGNOSIS — E1165 Type 2 diabetes mellitus with hyperglycemia: Secondary | ICD-10-CM | POA: Diagnosis not present

## 2018-01-06 DIAGNOSIS — M25562 Pain in left knee: Secondary | ICD-10-CM | POA: Diagnosis not present

## 2018-01-06 DIAGNOSIS — M545 Low back pain: Secondary | ICD-10-CM | POA: Diagnosis not present

## 2018-01-06 DIAGNOSIS — M1612 Unilateral primary osteoarthritis, left hip: Secondary | ICD-10-CM | POA: Diagnosis not present

## 2018-01-21 DIAGNOSIS — M5416 Radiculopathy, lumbar region: Secondary | ICD-10-CM | POA: Diagnosis not present

## 2018-01-21 DIAGNOSIS — M79605 Pain in left leg: Secondary | ICD-10-CM | POA: Diagnosis not present

## 2018-01-21 DIAGNOSIS — M461 Sacroiliitis, not elsewhere classified: Secondary | ICD-10-CM | POA: Diagnosis not present

## 2018-02-10 DIAGNOSIS — H2511 Age-related nuclear cataract, right eye: Secondary | ICD-10-CM | POA: Diagnosis not present

## 2018-02-10 DIAGNOSIS — E119 Type 2 diabetes mellitus without complications: Secondary | ICD-10-CM | POA: Diagnosis not present

## 2018-02-10 DIAGNOSIS — H3342 Traction detachment of retina, left eye: Secondary | ICD-10-CM | POA: Diagnosis not present

## 2018-02-10 DIAGNOSIS — H5213 Myopia, bilateral: Secondary | ICD-10-CM | POA: Diagnosis not present

## 2018-02-10 DIAGNOSIS — H33022 Retinal detachment with multiple breaks, left eye: Secondary | ICD-10-CM | POA: Diagnosis not present

## 2018-02-28 DIAGNOSIS — E119 Type 2 diabetes mellitus without complications: Secondary | ICD-10-CM | POA: Diagnosis not present

## 2018-02-28 DIAGNOSIS — H2511 Age-related nuclear cataract, right eye: Secondary | ICD-10-CM | POA: Diagnosis not present

## 2018-02-28 DIAGNOSIS — H26492 Other secondary cataract, left eye: Secondary | ICD-10-CM | POA: Diagnosis not present

## 2018-02-28 DIAGNOSIS — H25011 Cortical age-related cataract, right eye: Secondary | ICD-10-CM | POA: Diagnosis not present

## 2018-08-05 DIAGNOSIS — E7800 Pure hypercholesterolemia, unspecified: Secondary | ICD-10-CM | POA: Diagnosis not present

## 2018-08-05 DIAGNOSIS — E1165 Type 2 diabetes mellitus with hyperglycemia: Secondary | ICD-10-CM | POA: Diagnosis not present

## 2018-08-05 DIAGNOSIS — Z23 Encounter for immunization: Secondary | ICD-10-CM | POA: Diagnosis not present

## 2018-08-05 DIAGNOSIS — E039 Hypothyroidism, unspecified: Secondary | ICD-10-CM | POA: Diagnosis not present

## 2020-01-12 ENCOUNTER — Encounter: Payer: Self-pay | Admitting: Podiatry

## 2020-01-12 ENCOUNTER — Ambulatory Visit (INDEPENDENT_AMBULATORY_CARE_PROVIDER_SITE_OTHER): Payer: 59

## 2020-01-12 ENCOUNTER — Ambulatory Visit (INDEPENDENT_AMBULATORY_CARE_PROVIDER_SITE_OTHER): Payer: 59 | Admitting: Podiatry

## 2020-01-12 ENCOUNTER — Other Ambulatory Visit: Payer: Self-pay

## 2020-01-12 VITALS — BP 110/60 | HR 69 | Temp 97.2°F | Resp 16

## 2020-01-12 DIAGNOSIS — M2012 Hallux valgus (acquired), left foot: Secondary | ICD-10-CM

## 2020-01-12 DIAGNOSIS — M2011 Hallux valgus (acquired), right foot: Secondary | ICD-10-CM | POA: Diagnosis not present

## 2020-01-12 NOTE — Progress Notes (Signed)
Subjective:  Patient ID: Logan Gutierrez, male    DOB: 1960-11-28,  MRN: [id number] HPI Chief Complaint  Patient presents with  . 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 Diabetes  . New Patient (Initial Visit)    59 y.o. male presents with the above complaint.   ROS: Denies fever chills nausea vomiting muscle aches pains calf pain back pain chest pain shortness of breath.  59 year old male presents with chief complaint of painful bunions bilaterally states that they have been bothering him for years.  States that he has had them for as long as he can remember.  Seems to be getting worse over the past few years and painful primarily with shoe gear.  He states that they really do not hurt otherwise.  He is concerned that he may develop arthritis which will limit his ability to exercise which may affect his ability to control his diabetes.  Past Medical History:  Diagnosis Date  . Diabetes mellitus without complication ([place])   . Hyperlipidemia   . Kidney stones    Past Surgical History:  Procedure Laterality Date  . CHOLECYSTECTOMY    . LITHOTRIPSY    . NECK SURGERY      Current Outpatient Medications:  .  folic acid (FOLVITE) 1 MG tablet, Take 1 mg by mouth daily., Disp: , Rfl:  .  insulin lispro protamine-lispro (HUMALOG 50/50 MIX) (50-50) 100 UNIT/ML SUSP injection, Inject 50 Units into the skin 2 (two) times daily before a meal., Disp: , Rfl:  .  JARDIANCE 25 MG TABS tablet, Take 25 mg by mouth daily., Disp: , Rfl:  .  levothyroxine (SYNTHROID) 200 MCG tablet, Take 200 mcg by mouth every morning., Disp: , Rfl:  .  methotrexate (RHEUMATREX) 2.5 MG tablet, Take 10 mg by mouth once a week., Disp: , Rfl:  .  rosuvastatin (CRESTOR) 5 MG tablet, Take 5 mg by mouth daily., Disp: , Rfl:   Allergies  Allergen Reactions  . Penicillins Rash   Review of Systems Objective:   Vitals:   01/12/20 0833  BP: 110/60  Pulse: 69  Resp: 16  Temp: (!) 97.2 F (36.2 C)    General: Well developed, nourished, in no acute distress, alert and oriented x3   Dermatological: Skin is warm, dry and supple bilateral. Nails x 10 are well maintained; remaining integument appears unremarkable at this time. There are no open sores, no preulcerative lesions, no rash or signs of infection present.  Vascular: Dorsalis Pedis artery and Posterior Tibial artery pedal pulses are 2/4 bilateral with immedate capillary fill time. Pedal hair growth present. No varicosities and no lower extremity edema present bilateral.   Neruologic: Grossly intact via light touch bilateral. Vibratory intact via tuning fork bilateral. Protective threshold with Semmes Wienstein monofilament intact to all pedal sites bilateral. Patellar and Achilles deep tendon reflexes 2+ bilateral. No Babinski or clonus noted bilateral.   Musculoskeletal: No gross boney pedal deformities bilateral. No pain, crepitus, or limitation noted with foot and ankle range of motion bilateral. Muscular strength 5/5 in all groups tested bilateral.  Increase in the first intermetatarsal angle with large nonpulsatile nonreducible nodules first metatarsophalangeal joints bilaterally.  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  None of this is painful.  There is no crepitation range of motion is full bilaterally.  Gait: Unassisted, Nonantalgic.    Radiographs:  Radiographs taken today demonstrate an  osseously mature individual with an increase in the first intermetatarsal angle greater than 15 degrees on the left foot less than 15 degrees on the right foot but with metatarsal abductus bilaterally.  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  Mild tailor's bunion deformity is also noted right greater than left.  No joint space narrowing is noted.  No acute  findings are noted.  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  Plan: We discussed the pros and cons of surgery today he would like to consider surgical intervention in late summer early fall.  He has several things planned this summer that he cannot delay.  I will follow-up with him in the fall for a Lapidus procedure on the left and a capital osteotomy on the right appears.     Muskan Bolla T. [place], [place]

## 2020-03-23 ENCOUNTER — Ambulatory Visit (INDEPENDENT_AMBULATORY_CARE_PROVIDER_SITE_OTHER): Payer: 59 | Admitting: Ophthalmology

## 2020-03-23 ENCOUNTER — Other Ambulatory Visit: Payer: Self-pay

## 2020-03-23 ENCOUNTER — Encounter (INDEPENDENT_AMBULATORY_CARE_PROVIDER_SITE_OTHER): Payer: Self-pay | Admitting: Ophthalmology

## 2020-03-23 DIAGNOSIS — H3342 Traction detachment of retina, left eye: Secondary | ICD-10-CM

## 2020-03-23 DIAGNOSIS — H33022 Retinal detachment with multiple breaks, left eye: Secondary | ICD-10-CM

## 2020-03-23 DIAGNOSIS — E119 Type 2 diabetes mellitus without complications: Secondary | ICD-10-CM

## 2020-03-23 DIAGNOSIS — H43811 Vitreous degeneration, right eye: Secondary | ICD-10-CM | POA: Insufficient documentation

## 2020-03-23 DIAGNOSIS — Z8669 Personal history of other diseases of the nervous system and sense organs: Secondary | ICD-10-CM

## 2020-03-23 NOTE — Progress Notes (Signed)
03/23/2020     CHIEF COMPLAINT Patient presents for Retina Follow Up   HISTORY OF PRESENT ILLNESS: Logan Gutierrez is a 59 y.o. male who presents to the clinic today for:   HPI    Retina Follow Up    Patient presents with  Diabetic Retinopathy.  In both eyes.  This started 1 year ago.  Severity is mild.  Duration of 1 year.  Since onset it is stable.          Comments    1 Year Diabetic Exam OU  Pt reports intermittent floater OS. Pt denies noticeable changes to [place] OU. No ocular pain OU. LBS: 147 yesterday A1c: 7.5, 10/2019       Last edited by Rockie Neighbours, [place] on 03/23/2020  8:05 AM. (History)      Referring physician: Seward Carol, MD [address],  [place] [id number]  HISTORICAL INFORMATION:   Selected notes from the [facility]: No current outpatient medications on file. (Ophthalmic Drugs)   No current facility-administered medications for this visit. (Ophthalmic Drugs)   Current Outpatient Medications (Other)  Medication Sig  . folic acid (FOLVITE) 1 MG tablet Take 1 mg by mouth daily.  . insulin lispro protamine-lispro (HUMALOG 50/50 MIX) (50-50) 100 UNIT/ML SUSP injection Inject 50 Units into the skin 2 (two) times daily before a meal.  . JARDIANCE 25 MG TABS tablet Take 25 mg by mouth daily.  Marland Kitchen levothyroxine (SYNTHROID) 200 MCG tablet Take 200 mcg by mouth every morning.  . methotrexate (RHEUMATREX) 2.5 MG tablet Take 10 mg by mouth once a week.  . rosuvastatin (CRESTOR) 5 MG tablet Take 5 mg by mouth daily.   No current facility-administered medications for this visit. (Other)      REVIEW OF SYSTEMS:    ALLERGIES Allergies  Allergen Reactions  . Penicillins Rash    PAST MEDICAL HISTORY Past Medical History:  Diagnosis Date  . Diabetes mellitus without complication ([place])   . Hyperlipidemia   . Kidney stones    Past Surgical History:  Procedure Laterality Date  .  CHOLECYSTECTOMY    . LITHOTRIPSY    . NECK SURGERY      FAMILY HISTORY History reviewed. No pertinent family history.  SOCIAL HISTORY Social History   Tobacco Use  . Smoking status: Never Smoker  . Smokeless tobacco: Never Used  Substance Use Topics  . Alcohol use: Yes  . Drug use: No         OPHTHALMIC EXAM:  Base Eye Exam    Visual Acuity (ETDRS)      Right Left   Dist [place]  20/40 +2   Dist cc 20/25 +1    Dist ph [place]  20/25 +2   Correction: Contacts       Tonometry (Tonopen, 8:10 AM)      Right Left   Pressure 12 11       Pupils      Pupils Dark Light Shape React APD   Right PERRL 4 3 Round Brisk None   Left PERRL 4 3 Round Brisk None       Visual Fields (Counting fingers)      Left Right    Full Full       Extraocular Movement      Right Left    Full Full       Neuro/Psych    Oriented x3: Yes  Mood/Affect: Normal       Dilation    Both eyes: 1.0% Mydriacyl, 2.5% Phenylephrine @ 8:10 AM        Slit Lamp and Fundus Exam    External Exam      Right Left   External Normal Normal       Slit Lamp Exam      Right Left   Lids/Lashes Normal Normal   Conjunctiva/Sclera White and quiet White and quiet   Cornea Clear Clear   Anterior Chamber Deep and quiet Deep and quiet   Iris Round and reactive Round and reactive   Lens 2+ Nuclear sclerosis, 1+ Posterior subcapsular cataract Centered posterior chamber intraocular lens, Open posterior capsule   Anterior Vitreous Normal Normal       Fundus Exam      Right Left   Posterior Vitreous Posterior vitreous detachment Normal   Disc Normal Normal   C/D Ratio 0.1 0.3   Macula Normal Normal   Vessels Normal,, no DR Normal,, no DR   Periphery Normal, no holes or tears [place], a attached 360          IMAGING AND PROCEDURES  Imaging and Procedures for 03/23/20  OCT, Retina - OU - Both Eyes       Right Eye Quality was good. Scan locations included subfoveal. Central Foveal Thickness:  315. Progression has been stable. Findings include normal observations.   Left Eye Quality was good. Scan locations included subfoveal. Central Foveal Thickness: 324. Progression has been stable. Findings include normal observations.   Notes Posterior vitreous detachment,                ASSESSMENT/PLAN:  No problem-specific Assessment & Plan notes found for this encounter.      ICD-10-CM   1. 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 Diabetes mellitus without complication (HCC)  E11.9   4. History of retinal detachment  Z86.69   5. Posterior vitreous detachment of right eye  H43.811     1.  Retina is attached OS.  2.  No diabetic eye complications  3.  Return visit in 1 year or if new symptoms develop either eye Ophthalmic Meds Ordered this visit:  No orders of the defined types were placed in this encounter.      Return in about 1 year (around 03/23/2021) for DILATE OU, OCT, COLOR FP.  There are no Patient Instructions on file for this visit.   Explained the diagnoses, plan, and follow up with the patient and they expressed understanding.  Patient expressed understanding of the importance of proper follow up care.   Alford Highland Rankin M.D. Diseases & Surgery of the Retina and Vitreous [facility] 03/23/20     Abbreviations: M myopia (nearsighted); A astigmatism; H hyperopia (farsighted); P presbyopia; Mrx spectacle prescription;  CTL contact lenses; OD right eye; OS left eye; OU both eyes  XT exotropia; ET esotropia; PEK punctate epithelial keratitis; PEE punctate epithelial erosions; DES dry eye syndrome; MGD meibomian gland dysfunction; ATs artificial tears; PFAT's preservative free artificial tears; NSC nuclear sclerotic cataract; PSC posterior subcapsular cataract; ERM epi-retinal membrane; PVD posterior vitreous detachment; RD retinal  detachment; DM diabetes mellitus; DR diabetic retinopathy; NPDR non-proliferative diabetic retinopathy; PDR proliferative diabetic retinopathy; CSME clinically significant macular edema; DME diabetic macular edema; dbh dot blot hemorrhages; CWS cotton wool spot;  POAG primary open angle glaucoma; C/D cup-to-disc ratio; HVF humphrey visual field; GVF goldmann visual field; OCT optical coherence tomography; IOP intraocular pressure; BRVO Branch retinal vein occlusion; CRVO central retinal vein occlusion; CRAO central retinal artery occlusion; BRAO branch retinal artery occlusion; RT retinal tear; SB scleral buckle; PPV pars plana vitrectomy; VH Vitreous hemorrhage; PRP panretinal laser photocoagulation; IVK intravitreal kenalog; VMT vitreomacular traction; MH Macular hole;  NVD neovascularization of the disc; NVE neovascularization elsewhere; AREDS age related eye disease study; ARMD age related macular degeneration; POAG primary open angle glaucoma; EBMD epithelial/anterior basement membrane dystrophy; ACIOL anterior chamber intraocular lens; IOL intraocular lens; PCIOL posterior chamber intraocular lens; Phaco/IOL phacoemulsification with intraocular lens placement; Breese photorefractive keratectomy; LASIK laser assisted in situ keratomileusis; HTN hypertension; DM diabetes mellitus; COPD chronic obstructive pulmonary disease

## 2020-10-25 ENCOUNTER — Ambulatory Visit: Payer: 59 | Admitting: Podiatry

## 2020-11-08 ENCOUNTER — Encounter: Payer: Self-pay | Admitting: Podiatry

## 2020-11-08 ENCOUNTER — Other Ambulatory Visit: Payer: Self-pay

## 2020-11-08 ENCOUNTER — Ambulatory Visit (INDEPENDENT_AMBULATORY_CARE_PROVIDER_SITE_OTHER): Payer: 59

## 2020-11-08 ENCOUNTER — Ambulatory Visit: Payer: 59 | Admitting: Podiatry

## 2020-11-08 DIAGNOSIS — E119 Type 2 diabetes mellitus without complications: Secondary | ICD-10-CM | POA: Insufficient documentation

## 2020-11-08 DIAGNOSIS — N200 Calculus of kidney: Secondary | ICD-10-CM | POA: Insufficient documentation

## 2020-11-08 DIAGNOSIS — E039 Hypothyroidism, unspecified: Secondary | ICD-10-CM | POA: Insufficient documentation

## 2020-11-08 DIAGNOSIS — M2012 Hallux valgus (acquired), left foot: Secondary | ICD-10-CM | POA: Diagnosis not present

## 2020-11-08 DIAGNOSIS — E559 Vitamin D deficiency, unspecified: Secondary | ICD-10-CM | POA: Insufficient documentation

## 2020-11-08 DIAGNOSIS — E782 Mixed hyperlipidemia: Secondary | ICD-10-CM | POA: Insufficient documentation

## 2020-11-08 DIAGNOSIS — E663 Overweight: Secondary | ICD-10-CM | POA: Insufficient documentation

## 2020-11-08 DIAGNOSIS — E1165 Type 2 diabetes mellitus with hyperglycemia: Secondary | ICD-10-CM | POA: Insufficient documentation

## 2020-11-08 DIAGNOSIS — E7800 Pure hypercholesterolemia, unspecified: Secondary | ICD-10-CM | POA: Insufficient documentation

## 2020-11-08 HISTORY — DX: Hypothyroidism, unspecified: E03.9

## 2020-11-09 NOTE — Progress Notes (Signed)
He presents today for surgical consult regarding his left foot.  States that this left foot is just giving him more grief than he can handle.  Is starting to affect his daily activities his ability to continue his good health and continue to perform his daily activities.  Objective: Vital signs are stable he is alert and oriented x3.  Pulses are palpable.  Neurologic sensorium is intact.  Deep tendon reflexes are intact.  Muscle strength is 5/5 dorsiflexors plantar flexors inverters everters alter the musculature is intact.  Orthopedic evaluation does demonstrate severe bunion deformity left foot tailor's bunion deformity left over right.  Radiographs taken today demonstrate a metatarsus adductus with an increase in the first intermetatarsal angle greater than normal value and a hallux abductus angle greater than normal value.  Also there is an increase in the fourth intermetatarsal space resulting in tailor's bunion forming new lateral bowing of the fifth metatarsal.  This is causing abduction of the fifth toe and a painful nodular area to the fifth metatarsal phalangeal joint.  Reviewed his past medical history medications allergies surgeries and social history  Assessment: Hallux abductovalgus deformity moderate to severe in nature and a tailor's bunion deformity on the left foot.  Plan: Discussed in great detail today his surgical versus nonsurgical options.  We decided on surgical intervention.  This is going to consist of a Lapidus procedure possible second metatarsal osteotomy definite fifth metatarsal osteotomy with screws and plates.  He understands he will be in a cast nonweightbearing for up to 6 to 8 weeks.  He understands this and is amenable to it.  We did discuss the possible postop complications which may include but not limited to postop pain bleeding swelling affection recurrence need for further surgery overcorrection under correction also digit loss of limb loss of life.  We did provide  him with information regarding the surgery center as well as the anesthesia group normal follow-up with him in the near future for surgical intervention.

## 2020-12-26 ENCOUNTER — Telehealth: Payer: Self-pay | Admitting: Podiatry

## 2020-12-26 NOTE — Telephone Encounter (Signed)
DOS: 01/06/2021  Procedures: Lapidus Procedure Including Bunionectomy Lt (04599), Metatarsal Osteotomy Poss 2nd Lt and 5th Lt (77414), and Cast Application Lt  [employer] Effective 10/29/2020 - 10/28/2021  Deductible: $1,250 with $103.62 met and $1,146.38 remaining. Out of Pocket: $3,500 with $273.56 met and $3,226.44 remaining. CoInsurance: 10% Copay: $0

## 2020-12-28 ENCOUNTER — Telehealth: Payer: Self-pay

## 2020-12-28 NOTE — Telephone Encounter (Signed)
DOS 3/11/222  LAPIDUS PROCEDURE INC BUNIONECTOMY LT - 28297 METATARSAL OSTEOTOMY 5TH, POSS 1ND LT - 28308  [facility] EFFECTIVE DATE - 10/29/2020  PLAN DEDUCTIBLE - $1250.00 W/$6568.12 REMAINING OUT OF POCKET - $3500.00 W/ $3226.44 REMAINING COPAY $0.00 COINSURANCE - 10%    NOTIFICATION/PRIOR AUTHORIZATION NUMBER CASE STATUS CASE STATUS REASON PRIMARY CARE PHYSICIAN [id number] Closed Case Was Managed And Is Now Complete - ADVANCE NOTIFY DATE/TIME ADMISSION NOTIFY DATE/TIME 12/08/2020 09:18 AM CST - COVERAGE STATUS OVERALL COVERAGE STATUS Covered/Approved 1-1 CODE DESCRIPTION COVERAGE STATUS DECISION DATE FAC [facility] Coverage determination is reflected for the facility admission and is not a guarantee of payment for ongoing services. Covered/Approved 12/09/2020 1 28297 Correction, hallux valgus (bunionectomy) more Covered/Approved 12/09/2020   NOTIFICATION/PRIOR AUTHORIZATION NUMBER CASE STATUS CASE STATUS REASON PRIMARY CARE PHYSICIAN [id number] Closed Case Was Managed And Is Now Complete - ADVANCE NOTIFY DATE/TIME ADMISSION NOTIFY DATE/TIME 12/26/2020 02:02 PM CST - COVERAGE STATUS OVERALL COVERAGE STATUS Covered/Approved 1-3 CODE DESCRIPTION COVERAGE STATUS DECISION DATE FAC [facility] Coverage determination is reflected for the facility admission and is not a guarantee of payment for ongoing services. Covered/Approved 12/26/2020 1 28308 Osteotomy, with or without lengthening, more Covered/Approved 12/26/2020 2 28308 Osteotomy, with or without lengthening, more Covered/Approved 12/26/2020 3 28297 Correction, hallux valgus (bunionectomy) more  Correction, hallux valgus (bunionectomy), with sesamoidectomy, when performed; with first metatarsal and medial cuneiform joint arthrodesis, any method  Covered/Approved 12/26/2020

## 2021-01-04 ENCOUNTER — Other Ambulatory Visit: Payer: Self-pay | Admitting: Podiatry

## 2021-01-04 MED ORDER — OXYCODONE-ACETAMINOPHEN 10-325 MG PO TABS: 1.0000 | ORAL_TABLET | Freq: Three times a day (TID) | ORAL | 0 refills | Status: AC | PRN

## 2021-01-04 MED ORDER — CLINDAMYCIN HCL 150 MG PO CAPS: 150.0000 mg | ORAL_CAPSULE | Freq: Three times a day (TID) | ORAL | 0 refills | Status: DC

## 2021-01-04 MED ORDER — ONDANSETRON HCL 4 MG PO TABS: 4.0000 mg | ORAL_TABLET | Freq: Three times a day (TID) | ORAL | 0 refills | Status: DC | PRN

## 2021-01-06 DIAGNOSIS — M2012 Hallux valgus (acquired), left foot: Secondary | ICD-10-CM | POA: Diagnosis not present

## 2021-01-06 DIAGNOSIS — M21542 Acquired clubfoot, left foot: Secondary | ICD-10-CM

## 2021-01-12 ENCOUNTER — Ambulatory Visit (INDEPENDENT_AMBULATORY_CARE_PROVIDER_SITE_OTHER): Payer: 59 | Admitting: Podiatry

## 2021-01-12 ENCOUNTER — Other Ambulatory Visit: Payer: Self-pay

## 2021-01-12 ENCOUNTER — Encounter: Payer: Self-pay | Admitting: Podiatry

## 2021-01-12 VITALS — BP 146/81 | HR 69 | Temp 98.1°F

## 2021-01-12 DIAGNOSIS — M2012 Hallux valgus (acquired), left foot: Secondary | ICD-10-CM

## 2021-01-12 DIAGNOSIS — Z9889 Other specified postprocedural states: Secondary | ICD-10-CM

## 2021-01-12 MED ORDER — OXYCODONE-ACETAMINOPHEN 10-325 MG PO TABS: 1.0000 | ORAL_TABLET | Freq: Three times a day (TID) | ORAL | 0 refills | Status: AC | PRN

## 2021-01-12 NOTE — Progress Notes (Signed)
He presents today for his first postop visit date of surgery 01/06/2021 left foot Lapidus procedure with bunionectomy and fifth metatarsal osteotomy.  He denies fever chills nausea vomit muscle aches pains calf pain back pain chest pain shortness of breath.  States that the foot has been a little tender at times he continues to take his pain medication every 8 hours.  Objective: Vital signs are stable he is alert oriented x3 cast is loose proximally toes are warm to the touch and has good sensation distally he also can move the toes with dorsiflexion and plantarflexion passively and actively.  Cast is clean and dry.  Radiographs were unable to be taken at this time secondary to failure of equipment.  Assessment: Well-healing surgical foot noncomplicated at this time.  Plan: We will follow-up with him in 1 week for cast removal and recast.  X-rays can be taken at that time as well.

## 2021-01-24 ENCOUNTER — Other Ambulatory Visit: Payer: Self-pay

## 2021-01-24 ENCOUNTER — Ambulatory Visit (INDEPENDENT_AMBULATORY_CARE_PROVIDER_SITE_OTHER): Payer: 59 | Admitting: Podiatry

## 2021-01-24 ENCOUNTER — Ambulatory Visit (INDEPENDENT_AMBULATORY_CARE_PROVIDER_SITE_OTHER): Payer: 59

## 2021-01-24 ENCOUNTER — Encounter: Payer: Self-pay | Admitting: Podiatry

## 2021-01-24 DIAGNOSIS — M2012 Hallux valgus (acquired), left foot: Secondary | ICD-10-CM

## 2021-01-24 DIAGNOSIS — Z9889 Other specified postprocedural states: Secondary | ICD-10-CM

## 2021-01-24 NOTE — Progress Notes (Signed)
He presents today for follow-up of his Lapidus procedure and his fifth met osteotomy.  He states that he is doing pretty good continues to be in the cast.  Denies fever chills nausea vomiting muscle aches pains calf pain back pain chest pain shortness of breath.  States that he has 1 area of calf pain but the calf is not swollen and not tender.  States that he has been working from home try to keep the foot elevated states that he has had to step down on it a couple of times has been out of the house a couple times to go eat and went to a party.  Other than that he stayed off of it And elevated.  Objective: Cast is intact dry and clean on this plantar surface.  Once removed demonstrates dry sterile dressing intact was removed demonstrates no erythema mild edema no cellulitis drainage or odor.  Good range of motion of the first metatarsophalangeal joint considerable edema plantarly distally as well as dorsally.  He also has some pinpoint tenderness in 1 point of the calf it appears for the cast is rubbing him.  This does not accolade nor does it feel like a DVT.  Radiographs taken today demonstrate internal fixation is intact in all seen fractures.  Assessment postop visit #2 with cast removal.  Plan: Discussed etiology pathology and surgical therapies at this point I went ahead and redressed it today push smaller shorter cast on to help alleviate that calf pain should it become more prominent red or swollen or painful he is to notify us immediately.  Otherwise I have encouraged range of motion exercises for the forefoot of keeping the foot elevated as much as possible he states that he has been working from home and try to keep it elevated.

## 2021-02-07 ENCOUNTER — Other Ambulatory Visit: Payer: Self-pay

## 2021-02-07 ENCOUNTER — Ambulatory Visit (INDEPENDENT_AMBULATORY_CARE_PROVIDER_SITE_OTHER): Payer: 59 | Admitting: Podiatry

## 2021-02-07 ENCOUNTER — Encounter: Payer: Self-pay | Admitting: Podiatry

## 2021-02-07 ENCOUNTER — Ambulatory Visit (INDEPENDENT_AMBULATORY_CARE_PROVIDER_SITE_OTHER): Payer: 59

## 2021-02-07 DIAGNOSIS — Z9889 Other specified postprocedural states: Secondary | ICD-10-CM

## 2021-02-07 DIAGNOSIS — M2012 Hallux valgus (acquired), left foot: Secondary | ICD-10-CM

## 2021-02-07 NOTE — Progress Notes (Signed)
At this point he presents today for follow-up of his Lapidus procedure with bunionectomy and fifth met osteotomy left foot.  He states that he is really been doing well he says is feeling fine.  He says is stiff and feels tight.  Denies fever chills nausea vomiting muscle aches pains calf pain back pain chest pain shortness of breath.  Objective: Vital signs are stable alert oriented x3.  There is no erythema edema cellulitis drainage or odor once the cast was removed.  The foot does demonstrate some mild edema but great range of motion dorsiflexion plantarflexion of all of the toes.  Sensation is intact.  Assessment: Well-healing surgical foot left.  Plan: Placed him in a compression anklet today which he will remove at nighttime.  Also placed him in a cam walker and he will remain nonweightbearing for least the next 3 weeks.  Once he returns in 3 weeks x-rays will be taken and we will see if we can start partial weightbearing.

## 2021-02-08 ENCOUNTER — Other Ambulatory Visit: Payer: Self-pay | Admitting: Podiatry

## 2021-02-08 DIAGNOSIS — M2012 Hallux valgus (acquired), left foot: Secondary | ICD-10-CM

## 2021-02-21 ENCOUNTER — Encounter: Payer: 59 | Admitting: Podiatry

## 2021-02-28 ENCOUNTER — Ambulatory Visit (INDEPENDENT_AMBULATORY_CARE_PROVIDER_SITE_OTHER): Payer: 59

## 2021-02-28 ENCOUNTER — Ambulatory Visit (INDEPENDENT_AMBULATORY_CARE_PROVIDER_SITE_OTHER): Payer: 59 | Admitting: Podiatry

## 2021-02-28 ENCOUNTER — Encounter: Payer: Self-pay | Admitting: Podiatry

## 2021-02-28 ENCOUNTER — Other Ambulatory Visit: Payer: Self-pay

## 2021-02-28 DIAGNOSIS — M2012 Hallux valgus (acquired), left foot: Secondary | ICD-10-CM

## 2021-02-28 DIAGNOSIS — Z9889 Other specified postprocedural states: Secondary | ICD-10-CM

## 2021-02-28 NOTE — Progress Notes (Signed)
He presents today for follow-up of his surgery date of surgery 01/06/2021 status post Lapidus procedure and fifth metatarsal osteotomy with hammertoe repair second.  He states that is doing fine is just swollen and I think that is what bothers me the most.  Objective: Vitals are stable he is alert and oriented and oriented x3 presents today utilizing his knee scooter in his cam walker.  He has moderate edema to the left lower extremity good alignment of the first metatarsal phalangeal joint he also has limited range of motion of the first metatarsophalangeal joint very stiff possibly associated with edema and scar tissue.  Slowly healing Lapidus and fifth met os.  Plan: I am going request that he start physical therapy and start on partial weightbearing to the foot.  He understands and is amenable to a follow-up with me in about 2 weeks just for another set of x-rays to make sure we are still been solid.

## 2021-03-01 NOTE — Addendum Note (Signed)
Addended by: Lottie Rater E on: 03/01/2021 02:55 PM   Modules accepted: Orders

## 2021-03-16 ENCOUNTER — Other Ambulatory Visit: Payer: Self-pay

## 2021-03-16 ENCOUNTER — Ambulatory Visit (INDEPENDENT_AMBULATORY_CARE_PROVIDER_SITE_OTHER): Payer: 59 | Admitting: Podiatry

## 2021-03-16 ENCOUNTER — Ambulatory Visit (INDEPENDENT_AMBULATORY_CARE_PROVIDER_SITE_OTHER): Payer: 59

## 2021-03-16 ENCOUNTER — Encounter: Payer: Self-pay | Admitting: Podiatry

## 2021-03-16 DIAGNOSIS — Z9889 Other specified postprocedural states: Secondary | ICD-10-CM

## 2021-03-16 DIAGNOSIS — M2012 Hallux valgus (acquired), left foot: Secondary | ICD-10-CM | POA: Diagnosis not present

## 2021-03-19 NOTE — Progress Notes (Signed)
He presents today date of surgery 01/06/2021 status post Lapidus procedure.  He states is still kind of swollen just wondering when that was longer than a physical therapy would like me to be out of this boot sooner than later so that they can get my muscles moving to get the fluid out of my leg and foot.  Objective: Vital signs are stable he is alert oriented x3.  Pulses are palpable.  He has considerable edema about the foot.  Does still take on the characteristics of the lymphedema the it is pitting in nature.  At this point radiographically it appears that he is healing very nicely internal fixation is in good position there is no fracturing or loosening of the fixation.  Assessment: Well-healing surgical foot second fifth and first metatarsals.  Plan: I would allow him to try to get into a tennis shoe for physical therapy sake and then also going to go ahead and dispensed a Darco shoe for him today follow-up with him in a few weeks.  He will be scheduled for physical therapy to continue

## 2021-03-23 ENCOUNTER — Other Ambulatory Visit: Payer: Self-pay

## 2021-03-23 ENCOUNTER — Ambulatory Visit (INDEPENDENT_AMBULATORY_CARE_PROVIDER_SITE_OTHER): Payer: 59 | Admitting: Ophthalmology

## 2021-03-23 ENCOUNTER — Encounter (INDEPENDENT_AMBULATORY_CARE_PROVIDER_SITE_OTHER): Payer: Self-pay | Admitting: Ophthalmology

## 2021-03-23 DIAGNOSIS — H43811 Vitreous degeneration, right eye: Secondary | ICD-10-CM

## 2021-03-23 DIAGNOSIS — H33022 Retinal detachment with multiple breaks, left eye: Secondary | ICD-10-CM

## 2021-03-23 DIAGNOSIS — H3342 Traction detachment of retina, left eye: Secondary | ICD-10-CM | POA: Diagnosis not present

## 2021-03-23 DIAGNOSIS — H25041 Posterior subcapsular polar age-related cataract, right eye: Secondary | ICD-10-CM

## 2021-03-23 NOTE — Progress Notes (Signed)
03/23/2021     CHIEF COMPLAINT Patient presents for Retina Follow Up (1 yr fu and OCT/Pt states, "My va has been really good. I still have an occasional floater every now and then but they are not there all the time."/A1C: 7.1/LBS: 121)   HISTORY OF PRESENT ILLNESS: Logan Gutierrez is a 60 y.o. male who presents to the clinic today for:   HPI    Retina Follow Up    Diagnosis: Other   Laterality: both eyes   Onset: 1 year ago   Severity: mild   Duration: 1 year   Comments: 1 yr fu and OCT Pt states, "My va has been really good. I still have an occasional floater every now and then but they are not there all the time." A1C: 7.1 LBS: 121       Last edited by Demetrios Loll, COA on 03/23/2021  8:07 AM. (History)      Referring physician: Renford Dills, MD [address] [place],  [place] [address]  HISTORICAL INFORMATION:   Selected notes from the MEDICAL RECORD NUMBER       CURRENT MEDICATIONS: No current outpatient medications on file. (Ophthalmic Drugs)   No current facility-administered medications for this visit. (Ophthalmic Drugs)   Current Outpatient Medications (Other)  Medication Sig  . folic acid (FOLVITE) 1 MG tablet Take 1 mg by mouth daily.  . insulin lispro protamine-lispro (HUMALOG 50/50 MIX) (50-50) 100 UNIT/ML SUSP injection Inject 50 Units into the skin 2 (two) times daily before a meal.  . JARDIANCE 25 MG TABS tablet Take 25 mg by mouth daily.  Marland Kitchen levothyroxine (SYNTHROID) 200 MCG tablet Take 200 mcg by mouth every morning.  . methotrexate (RHEUMATREX) 2.5 MG tablet Take 10 mg by mouth once a week.  . ondansetron (ZOFRAN) 4 MG tablet Take 1 tablet (4 mg total) by mouth every 8 (eight) hours as needed.  . rosuvastatin (CRESTOR) 5 MG tablet Take 5 mg by mouth daily.   No current facility-administered medications for this visit. (Other)      REVIEW OF SYSTEMS:    ALLERGIES Allergies  Allergen Reactions  . Penicillins Rash     PAST MEDICAL HISTORY Past Medical History:  Diagnosis Date  . Diabetes mellitus without complication (HCC)   . Hyperlipidemia   . Hypothyroidism 11/08/2020  . Kidney stones    Past Surgical History:  Procedure Laterality Date  . CHOLECYSTECTOMY    . LITHOTRIPSY    . NECK SURGERY      FAMILY HISTORY History reviewed. No pertinent family history.  SOCIAL HISTORY Social History   Tobacco Use  . Smoking status: Never Smoker  . Smokeless tobacco: Never Used  Substance Use Topics  . Alcohol use: Yes  . Drug use: No         OPHTHALMIC EXAM: Base Eye Exam    Visual Acuity (ETDRS)      Right Left   Dist [place]  20/30 -1   Dist cc 20/20 -1    Dist ph [place]  20/20 -1   Correction: Contacts       Tonometry (Tonopen, 8:11 AM)      Right Left   Pressure 10 12       Pupils      Pupils Dark Light Shape React APD   Right PERRL 4 3 Round Brisk None   Left PERRL 4 3 Round Brisk None       Visual Fields (Counting fingers)  Left Right    Full Full       Extraocular Movement      Right Left    Full Full       Neuro/Psych    Oriented x3: Yes   Mood/Affect: Normal       Dilation    Both eyes: 1.0% Mydriacyl, 2.5% Phenylephrine @ 8:11 AM        Slit Lamp and Fundus Exam    External Exam      Right Left   External Normal Normal       Slit Lamp Exam      Right Left   Lids/Lashes Normal Normal   Conjunctiva/Sclera White and quiet White and quiet   Cornea Clear Clear   Anterior Chamber Deep and quiet Deep and quiet   Iris Round and reactive Round and reactive   Lens 2+ Nuclear sclerosis, 2+ Posterior subcapsular cataract Centered posterior chamber intraocular lens, Open posterior capsule   Anterior Vitreous Normal Normal       Fundus Exam      Right Left   Posterior Vitreous Posterior vitreous detachment Normal, clear avitric   Disc Normal Normal   C/D Ratio 0.1 0.3   Macula Normal Normal   Vessels Normal,, no DR Normal,, no DR   Periphery Normal,  no holes or tears [place],  attached 360,, chorioretinal scarring laser induce a attached temporally inferiorly          IMAGING AND PROCEDURES  Imaging and Procedures for 03/23/21  OCT, Retina - OU - Both Eyes       Right Eye Quality was good. Scan locations included subfoveal. Central Foveal Thickness: 313. Progression has been stable. Findings include normal observations.   Left Eye Quality was good. Scan locations included subfoveal. Central Foveal Thickness: 324. Progression has been stable. Findings include normal observations.   Notes Posterior vitreous detachment, right eye  OS avitric                ASSESSMENT/PLAN:  Type 2 diabetes mellitus, controlled (HCC) The patient has diabetes without any evidence of retinopathy. The patient advised to maintain good blood glucose control, excellent blood pressure control, and favorable levels of cholesterol, low density lipoprotein, and high density lipoproteins. Follow up in 1 year was recommended. Explained that fluctuations in visual acuity , or "out of focus", may result from large variations of blood sugar control.  Posterior vitreous detachment of right eye No holes or tears OD      ICD-10-CM   1. 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 Posterior vitreous detachment of right eye  H43.811   4. 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  History of retinal tear detachment left eye status post repair.  Good acuity pseudophakic state  2.  Phakic OD with central posterior subcapsular cataract yet not visually significant nor noticeable to the patient.  Follow-up with Dr. Alben Spittle as scheduled  3.  No detectable diabetic retinopathy  Ophthalmic Meds Ordered this visit:  No orders of the defined types were placed in this encounter.      Return in about 1 year (around 03/23/2022)  for DILATE OU, COLOR FP.  There are no Patient Instructions on file for this visit.   Explained the diagnoses, plan, and follow up with the patient and they expressed understanding.  Patient expressed understanding of the importance of proper follow up care.   Alford Highland Nino Amano M.D. Diseases & Surgery of the Retina and Vitreous [facility] 03/23/21     Abbreviations: M myopia (nearsighted); A astigmatism; H hyperopia (farsighted); P presbyopia; Mrx spectacle prescription;  CTL contact lenses; OD right eye; OS left eye; OU both eyes  XT exotropia; ET esotropia; PEK punctate epithelial keratitis; PEE punctate epithelial erosions; DES dry eye syndrome; MGD meibomian gland dysfunction; ATs artificial tears; PFAT's preservative free artificial tears; NSC nuclear sclerotic cataract; PSC posterior subcapsular cataract; ERM epi-retinal membrane; PVD posterior vitreous detachment; RD retinal detachment; DM diabetes mellitus; DR diabetic retinopathy; NPDR non-proliferative diabetic retinopathy; PDR proliferative diabetic retinopathy; CSME clinically significant macular edema; DME diabetic macular edema; dbh dot blot hemorrhages; CWS cotton wool spot; POAG primary open angle glaucoma; C/D cup-to-disc ratio; HVF humphrey visual field; GVF goldmann visual field; OCT optical coherence tomography; IOP intraocular pressure; BRVO Branch retinal vein occlusion; CRVO central retinal vein occlusion; CRAO central retinal artery occlusion; BRAO branch retinal artery occlusion; RT retinal tear; SB scleral buckle; PPV pars plana vitrectomy; VH Vitreous hemorrhage; PRP panretinal laser photocoagulation; IVK intravitreal kenalog; VMT vitreomacular traction; MH Macular hole;  NVD neovascularization of the disc; NVE neovascularization elsewhere; AREDS age related eye disease study; ARMD age related macular degeneration; POAG primary open angle glaucoma; EBMD epithelial/anterior basement membrane dystrophy; ACIOL  anterior chamber intraocular lens; IOL intraocular lens; PCIOL posterior chamber intraocular lens; Phaco/IOL phacoemulsification with intraocular lens placement; PRK photorefractive keratectomy; LASIK laser assisted in situ keratomileusis; HTN hypertension; DM diabetes mellitus; COPD chronic obstructive pulmonary disease

## 2021-03-23 NOTE — Assessment & Plan Note (Signed)

## 2021-03-23 NOTE — Assessment & Plan Note (Signed)
No holes or tears OD 

## 2021-04-25 ENCOUNTER — Encounter: Payer: 59 | Admitting: Podiatry

## 2021-05-23 ENCOUNTER — Other Ambulatory Visit: Payer: Self-pay

## 2021-05-23 ENCOUNTER — Ambulatory Visit: Payer: 59 | Admitting: Podiatry

## 2021-05-23 ENCOUNTER — Encounter: Payer: Self-pay | Admitting: Podiatry

## 2021-05-23 DIAGNOSIS — M2012 Hallux valgus (acquired), left foot: Secondary | ICD-10-CM | POA: Diagnosis not present

## 2021-05-23 DIAGNOSIS — Z9889 Other specified postprocedural states: Secondary | ICD-10-CM | POA: Diagnosis not present

## 2021-05-24 NOTE — Progress Notes (Signed)
He presents today for follow-up of his left surgical foot status post Lapidus procedure and fifth metatarsal osteotomy date of surgery 01/06/2021 states that he is doing quite well however the contralateral foot is beginning to become more more painful and he would like to consider having that fixed in the near future.  Objective: Vital signs are stable alert and oriented x3.  Pulses are palpable.  Left foot is gone on to heal quite nicely still has moderate edema.  Right foot demonstrates strong palpable pulses severe bunion deformities previously noted when he first visited with hypermobile first metatarsal medial cuneiform joint he also has tailor's bunion deformity.  Assessment well-healing surgical foot left.  Severe bunion deformity on the right side and tailor's bunion deformity right.  Plan: Discussed etiology pathology conservative surgical therapies at this point we are going to go ahead and get him consented for his left foot consisting of a Lapidus procedure fifth metatarsal osteotomy and cast.  He understands this and is amenable to it discussed all mobic possible postop complications which may include but not limited to postop pain bleeding swelling infection recurrence need for further surgery overcorrection under correction loss of digit loss of limb loss of life.  He was provided with the appropriate paperwork information regarding the surgery center and anesthesia group and we will schedule him in the near future for surgical intervention.

## 2021-08-30 ENCOUNTER — Telehealth: Payer: Self-pay | Admitting: Urology

## 2021-08-30 NOTE — Telephone Encounter (Signed)
DOS - 09/01/21  LAPIDUS PROCEDURE INCLUDING BUNIONECTOMY RIGHT --- 30131 METATARSAL OSTEOTOMY 5TH RIGHT --- 28308  [facility] EFFECTIVE DATE - 10/29/20  PLAN DEDUCTIBLE - $1,250.00 W/ $0.00 REMAINING OUT OF POCKET - $3,500.00 W/ $213.10 REMAINING COINSURANCE - 10% COPAY - $0.00  PER [facility] SITE FOR CPT CODES 43888 AND 28308 HAVE BEEN APPROVED, AUTH # [id number],  GOOD FROM 09/01/21 - 11/30/21

## 2021-08-31 ENCOUNTER — Other Ambulatory Visit: Payer: Self-pay | Admitting: Podiatry

## 2021-08-31 MED ORDER — OXYCODONE-ACETAMINOPHEN 10-325 MG PO TABS: 1.0000 | ORAL_TABLET | Freq: Three times a day (TID) | ORAL | 0 refills | Status: AC | PRN

## 2021-08-31 MED ORDER — CLINDAMYCIN HCL 150 MG PO CAPS: 150.0000 mg | ORAL_CAPSULE | Freq: Three times a day (TID) | ORAL | 0 refills | Status: DC

## 2021-08-31 MED ORDER — ONDANSETRON HCL 4 MG PO TABS: 4.0000 mg | ORAL_TABLET | Freq: Three times a day (TID) | ORAL | 0 refills | Status: DC | PRN

## 2021-09-01 DIAGNOSIS — M21541 Acquired clubfoot, right foot: Secondary | ICD-10-CM | POA: Diagnosis not present

## 2021-09-01 DIAGNOSIS — M2011 Hallux valgus (acquired), right foot: Secondary | ICD-10-CM | POA: Diagnosis not present

## 2021-09-04 ENCOUNTER — Telehealth: Payer: Self-pay | Admitting: *Deleted

## 2021-09-04 NOTE — Telephone Encounter (Signed)
Patient is calling because he is having some constipation (no bowel movement)since surgery, has tried OTC dulcolax, not helping. He is normally very regular.Please advise.

## 2021-09-06 NOTE — Telephone Encounter (Signed)
Returned call to patient to give recommendations, busy, will try later.

## 2021-09-07 ENCOUNTER — Encounter: Payer: Self-pay | Admitting: Podiatry

## 2021-09-07 ENCOUNTER — Other Ambulatory Visit: Payer: Self-pay

## 2021-09-07 ENCOUNTER — Ambulatory Visit (INDEPENDENT_AMBULATORY_CARE_PROVIDER_SITE_OTHER): Payer: 59

## 2021-09-07 ENCOUNTER — Ambulatory Visit (INDEPENDENT_AMBULATORY_CARE_PROVIDER_SITE_OTHER): Payer: 59 | Admitting: Podiatry

## 2021-09-07 VITALS — BP 108/61 | HR 89 | Temp 98.6°F

## 2021-09-07 DIAGNOSIS — M2011 Hallux valgus (acquired), right foot: Secondary | ICD-10-CM | POA: Diagnosis not present

## 2021-09-07 DIAGNOSIS — Z9889 Other specified postprocedural states: Secondary | ICD-10-CM

## 2021-09-07 MED ORDER — OXYCODONE-ACETAMINOPHEN 10-325 MG PO TABS: 1.0000 | ORAL_TABLET | Freq: Three times a day (TID) | ORAL | 0 refills | Status: AC | PRN

## 2021-09-07 NOTE — Progress Notes (Signed)
He presents today for his first postop visit he is status post Lapidus bunionectomy fifth metatarsal osteotomy date of surgery was 09/01/2021 states this been a little painful little tight around the ankle and is hard to sleep at night.  But for the most part he is doing pretty well.  He denies fever chills nausea run muscle aches pains calf pain back pain chest pain shortness of breath.  Objective: Cast is intact loose at the top proximally and he can still move his toes and feel his toes distally.  Radiographs taken today demonstrate slightly overcorrected hallux at the metatarsophalangeal joint most likely secondary to the dressing at this point.  Otherwise the correction appears to be in good position.  Assessment well-healing surgical foot.  Plan: Bivalve the cast for him today which relieved a lot of pressure around the ankle.  Better range of motion of his toes and I felt that this was enough to limit his pain.  We did discuss the use of Dulcolax for stool softener secondary to the pain medication and I refilled his pain medication today.  I will follow-up with him in 1 week at which time we will remove the cast and replace it with a new dressed a compressive dressing and a new cast.

## 2021-09-14 ENCOUNTER — Ambulatory Visit (INDEPENDENT_AMBULATORY_CARE_PROVIDER_SITE_OTHER): Payer: 59 | Admitting: Podiatry

## 2021-09-14 ENCOUNTER — Encounter: Payer: Self-pay | Admitting: Podiatry

## 2021-09-14 ENCOUNTER — Other Ambulatory Visit: Payer: Self-pay

## 2021-09-14 DIAGNOSIS — Z9889 Other specified postprocedural states: Secondary | ICD-10-CM

## 2021-09-14 DIAGNOSIS — M2011 Hallux valgus (acquired), right foot: Secondary | ICD-10-CM

## 2021-09-17 NOTE — Progress Notes (Signed)
He presents today for second postop visit date of surgery 09/01/2021 status post Lapidus procedure fifth metatarsal osteotomy with a cast.  He denies fever chills nausea vomiting muscle aches pains calf pain back pain chest pain shortness of breath.  Objective: Vital signs are stable he is alert and oriented x3.  Cast dry and intact previously bivalved due to tightness.  Dry and clean was removed does demonstrate dry sterile dressing intact mild edema no cellulitis drainage or odor toe is rectus at the first metatarsophalangeal joint the foot is much more narrow than it was previously fifth metatarsal osteotomy appears to be in good position as well.  Assessment: Well-healing surgical foot.  Plan: Dressed today with a dressed a compressive dressing and reapplied his below-knee cast.  We will follow-up with him in about 2 weeks for removal of this cast possibly another application.

## 2021-09-28 ENCOUNTER — Ambulatory Visit (INDEPENDENT_AMBULATORY_CARE_PROVIDER_SITE_OTHER): Payer: 59

## 2021-09-28 ENCOUNTER — Other Ambulatory Visit: Payer: Self-pay

## 2021-09-28 ENCOUNTER — Ambulatory Visit (INDEPENDENT_AMBULATORY_CARE_PROVIDER_SITE_OTHER): Payer: 59 | Admitting: Podiatry

## 2021-09-28 ENCOUNTER — Encounter: Payer: Self-pay | Admitting: Podiatry

## 2021-09-28 DIAGNOSIS — Z9889 Other specified postprocedural states: Secondary | ICD-10-CM

## 2021-09-28 DIAGNOSIS — M2011 Hallux valgus (acquired), right foot: Secondary | ICD-10-CM

## 2021-09-28 DIAGNOSIS — M1990 Unspecified osteoarthritis, unspecified site: Secondary | ICD-10-CM | POA: Insufficient documentation

## 2021-09-28 NOTE — Progress Notes (Signed)
He presents today for his third postop visit date of surgery 09/01/2021 Lapidus procedure bunionectomy right foot fifth metatarsal osteotomy.  He denies fever chills nausea vomiting muscle aches pains calf pain back pain chest pain shortness of breath.  objective: Cast is dry and intact was removed today reveals dry sterile dressing is intact.  Staples and sutures are intact all removed today margins remain well coapted.  Radiographs taken today demonstrate fifth met osteotomy in good position with internal fixation in good position his first ray correction is in good position however he does have some slight diastases at the fusion site.  Internal fixation appears to be as it was previously though it does appear that there is some diastases plantarly.  So this may take a little bit longer to heal but I do think that it will ultimately going for fusion.  Assessment: Well-healing surgical foot.  Slight delay and fusion of the first metatarsal medial cuneiform joint.  Plan placed him in his cam walker today he will continue nonweightbearing status for at least another 2 weeks hopefully will take much longer than that to get some bone growth if it does he understands and is amendable to the fact that we would need to take as long as necessary he does have a trip planned at the end of the month.

## 2021-10-12 ENCOUNTER — Encounter: Payer: Self-pay | Admitting: Podiatry

## 2021-10-12 ENCOUNTER — Other Ambulatory Visit: Payer: Self-pay

## 2021-10-12 ENCOUNTER — Ambulatory Visit (INDEPENDENT_AMBULATORY_CARE_PROVIDER_SITE_OTHER): Payer: 59 | Admitting: Podiatry

## 2021-10-12 ENCOUNTER — Ambulatory Visit (INDEPENDENT_AMBULATORY_CARE_PROVIDER_SITE_OTHER): Payer: 59

## 2021-10-12 DIAGNOSIS — M21621 Bunionette of right foot: Secondary | ICD-10-CM | POA: Diagnosis not present

## 2021-10-12 DIAGNOSIS — M2011 Hallux valgus (acquired), right foot: Secondary | ICD-10-CM | POA: Diagnosis not present

## 2021-10-12 DIAGNOSIS — L97511 Non-pressure chronic ulcer of other part of right foot limited to breakdown of skin: Secondary | ICD-10-CM | POA: Diagnosis not present

## 2021-10-12 DIAGNOSIS — Z9889 Other specified postprocedural states: Secondary | ICD-10-CM

## 2021-10-12 MED ORDER — CLINDAMYCIN HCL 150 MG PO CAPS: 150.0000 mg | ORAL_CAPSULE | Freq: Three times a day (TID) | ORAL | 0 refills | Status: DC

## 2021-10-14 NOTE — Progress Notes (Signed)
He presents today for follow-up of his Lapidus procedure and the fifth met osteotomy.  He states that it is great there is no pain whatsoever he states that I have the little wound on the top that seems to be getting smaller regularly.  Objective: Vital signs are stable he is alert and oriented x3.  Pulses are palpable.  Still has some edema about the first metatarsophalangeal joint.  At the distalmost aspect of his incision there was a small 1.5 cm in length wound that measures about 0.7 cm wide demonstrating a circumferential marginal eschar with central fibrosis.  I debrided the margins today which did demonstrate bleeding and I debrided the fibrosis.  I did not see tendon and there appeared to be epithelialization starting to occur centrally.  He does have good range of motion of the first metatarsophalangeal joint and radiographs do demonstrate that it appears to be healing very nicely.  The view of the foot today demonstrates tibial sesamoid position appears to be medial much more so than previous evaluation of the x-rays but yet this has not been walked on.  I do think that it is most likely positional radiography.  Internal fixation is holding does not demonstrate any fracturing compression appears to be good and there does appear to be some healing across the fusion site.  Assessment well-healing Lapidus however there is a wound to the dorsal distal aspect.  Plan I debrided the wound today debriding the eschar down to granulation tissue.  I recommended that he continue wound dressing I started him on clindamycin and I provided him with Iodosorb.  I would like to follow-up with him in about 2 weeks I would also allow him to start walking with partial weightbearing in the cam walker.  I will follow-up with him with any other questions or concerns.

## 2021-11-07 ENCOUNTER — Ambulatory Visit (INDEPENDENT_AMBULATORY_CARE_PROVIDER_SITE_OTHER): Payer: 59 | Admitting: Podiatry

## 2021-11-07 ENCOUNTER — Other Ambulatory Visit: Payer: Self-pay

## 2021-11-07 ENCOUNTER — Encounter: Payer: Self-pay | Admitting: Podiatry

## 2021-11-07 DIAGNOSIS — M2011 Hallux valgus (acquired), right foot: Secondary | ICD-10-CM

## 2021-11-07 DIAGNOSIS — M21621 Bunionette of right foot: Secondary | ICD-10-CM

## 2021-11-07 DIAGNOSIS — Z9889 Other specified postprocedural states: Secondary | ICD-10-CM

## 2021-11-07 NOTE — Progress Notes (Signed)
Trevonta presents today date of surgery 09/01/2021 Lapidus procedure fifth metatarsal osteotomy and application of the cast.  He states that the wound is looking much better he says it feels like it is about 90% there.  Objective: Vital signs are stable he is alert and oriented x3 there is no erythema to some mild edema no cellulitis drainage or odor his foot is rectus the wound to the dorsal aspect of the foot has closed up to a single scab at this point time.  It he has good range of motion of the first metatarsophalangeal joint no pain on palpation of the first or fifth.  Assessment: Well-healing surgical foot.  Plan: I would not let him try to get back into a tennis shoe or shoe with some support should his [place] mid arch start to become painful he is to get back into his cam boot.  He will follow-up with me in 2 to 4 weeks for another set of x-rays of that right foot.  He will continue treatment to the scab until it is completely resolved.

## 2021-12-12 ENCOUNTER — Ambulatory Visit (INDEPENDENT_AMBULATORY_CARE_PROVIDER_SITE_OTHER): Payer: 59

## 2021-12-12 ENCOUNTER — Ambulatory Visit (INDEPENDENT_AMBULATORY_CARE_PROVIDER_SITE_OTHER): Payer: 59 | Admitting: Podiatry

## 2021-12-12 ENCOUNTER — Encounter: Payer: Self-pay | Admitting: Podiatry

## 2021-12-12 ENCOUNTER — Other Ambulatory Visit: Payer: Self-pay

## 2021-12-12 DIAGNOSIS — Z9889 Other specified postprocedural states: Secondary | ICD-10-CM

## 2021-12-12 DIAGNOSIS — M2011 Hallux valgus (acquired), right foot: Secondary | ICD-10-CM | POA: Diagnosis not present

## 2021-12-12 DIAGNOSIS — M21621 Bunionette of right foot: Secondary | ICD-10-CM

## 2021-12-12 NOTE — Progress Notes (Signed)
He presents today for his final postop visit regarding Lapidus bunionectomy fifth metatarsal osteotomy and application of cast.  He states that is feeling good and the wound has healed completely as he refers to the wound that was on the dorsal distal aspect of his incision site.  He states that his stay swollen but him back into regular shoes.  Objective: Vital signs are stable alert and oriented x3.  Pulses are palpable.  There is no erythema cellulitis drainage or odor to some mild edema he has great range of motion of the first metatarsophalangeal joint.  Radiographs taken today demonstrate tibial sesamoid medial to the articulation but the toe is in perfect alignment.  No open lesions or wounds are noted.  Previous wound is completely healed.  Radiographs taken today demonstrate completely healed osteotomy with internal fixation in good position and intact.  Assessment well-healing surgical foot.  Plan: Get back to his regular activities follow-up with me with any questions or concerns.

## 2022-02-21 ENCOUNTER — Encounter (INDEPENDENT_AMBULATORY_CARE_PROVIDER_SITE_OTHER): Payer: Self-pay

## 2022-03-19 ENCOUNTER — Telehealth: Payer: Self-pay | Admitting: *Deleted

## 2022-03-19 ENCOUNTER — Ambulatory Visit (INDEPENDENT_AMBULATORY_CARE_PROVIDER_SITE_OTHER): Payer: 59

## 2022-03-19 ENCOUNTER — Ambulatory Visit: Payer: 59 | Admitting: Podiatry

## 2022-03-19 DIAGNOSIS — S90211A Contusion of right great toe with damage to nail, initial encounter: Secondary | ICD-10-CM | POA: Diagnosis not present

## 2022-03-19 DIAGNOSIS — R52 Pain, unspecified: Secondary | ICD-10-CM

## 2022-03-19 DIAGNOSIS — M2011 Hallux valgus (acquired), right foot: Secondary | ICD-10-CM | POA: Diagnosis not present

## 2022-03-19 MED ORDER — DOXYCYCLINE HYCLATE 100 MG PO TABS: 100.0000 mg | ORAL_TABLET | Freq: Two times a day (BID) | ORAL | 0 refills | Status: DC

## 2022-03-19 NOTE — Progress Notes (Signed)
  Subjective:  Patient ID: Logan Gutierrez, male    DOB: 1961-06-17,  MRN: [id number]  Chief Complaint  Patient presents with   Post-op Problem       fell and hit post procedural toe purple swollen- Ok per Marchelle Folks    61 y.o. male presents with the above complaint. History confirmed with patient.  He previously underwent Lapidus bunionectomy last fall with Dr. Al Corpus, hit his right great toe yesterday and injured its been bruised and swollen and painful.  Objective:  Physical Exam: warm, good capillary refill, no trophic changes or ulcerative lesions, normal DP and PT pulses, normal sensory exam, and well-healed surgical scar, the right great toe is ecchymotic tender and bruised there is a subungual hematoma, nail plate is well still attached.   Radiographs: Multiple views x-ray of the right foot: Possible fracture of distal tuft, nondisplaced, no complication of hardware from prior surgical site Assessment:   1. Contusion of right great toe with damage to nail, initial encounter   2. Subungual hematoma of great toe of right foot, initial encounter   3. New complaint of pain      Plan:  Patient was evaluated and treated and all questions answered.  We reviewed his radiographs together discussed that likely there is no complication of his hardware from his injury.  He may have a nondisplaced fracture of the distal tuft of the distal phalanx but does not require surgical intervention.  He is comfortable and supportive shoe gear.  I did place him on doxycycline as a precaution due to the injury involving the nailbed.  The nail plate was still well attached and I recommended evacuation of the subungual hematoma, I anesthetized the great toe with lidocaine and Marcaine and 3 puncture holes with an 18-gauge needle were made which evacuated the hematoma.  He tolerated this well.  Discussed signs and symptoms of infection or recurrence and possible need to avulsed the nail plate at a later date if  it becomes worse.  He will return to see me as needed.  Return if symptoms worsen or fail to improve.

## 2022-03-19 NOTE — Telephone Encounter (Signed)
Good, okay, thank you!

## 2022-03-19 NOTE — Telephone Encounter (Signed)
You can put him in tomorrow at 1:45

## 2022-03-19 NOTE — Telephone Encounter (Signed)
Patient fell and hit his post procedural toe on a wall this weekend,in pain and toe is purple. He would like to come in and have a toe xray to make sure everything is ok.Please schedule for soonest appointment.

## 2022-04-02 ENCOUNTER — Encounter (INDEPENDENT_AMBULATORY_CARE_PROVIDER_SITE_OTHER): Payer: Self-pay | Admitting: Ophthalmology

## 2022-04-02 ENCOUNTER — Ambulatory Visit (INDEPENDENT_AMBULATORY_CARE_PROVIDER_SITE_OTHER): Payer: 59 | Admitting: Ophthalmology

## 2022-04-02 DIAGNOSIS — E119 Type 2 diabetes mellitus without complications: Secondary | ICD-10-CM

## 2022-04-02 DIAGNOSIS — H3342 Traction detachment of retina, left eye: Secondary | ICD-10-CM

## 2022-04-02 DIAGNOSIS — H2511 Age-related nuclear cataract, right eye: Secondary | ICD-10-CM | POA: Diagnosis not present

## 2022-04-02 DIAGNOSIS — H33022 Retinal detachment with multiple breaks, left eye: Secondary | ICD-10-CM

## 2022-04-02 DIAGNOSIS — H43811 Vitreous degeneration, right eye: Secondary | ICD-10-CM

## 2022-04-02 NOTE — Assessment & Plan Note (Signed)
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  Under the care and evaluation of Dr. Zenaida Niece in direction of Dr. Zenaida Niece follow-up as scheduled regarding cataract and image disparity issues in the future

## 2022-04-02 NOTE — Assessment & Plan Note (Signed)
History of scleral buckle 2018's followed by subsequent vitrectomy membrane peel, looks great.

## 2022-04-02 NOTE — Progress Notes (Addendum)
04/02/2022     CHIEF COMPLAINT Patient presents for  Chief Complaint  Patient presents with   Retina Follow Up      HISTORY OF PRESENT ILLNESS: Logan Gutierrez is a 61 y.o. male who presents to the clinic today for:   HPI     Retina Follow Up           Diagnosis: Traction detachment of left retina   Laterality: left eye   Onset: 1 year ago         Comments   1 yr FU OU FP. Patient states vision is stable and unchanged since last visit. Denies any new floaters or FOL. Patient states he had a visit with Dr. Zenaida Niece 1 month ago, and had a visit with his optometrist for contact lenses 3 weeks ago but he did not continue with contacts.       Last edited by Nelva Nay on 04/02/2022  8:03 AM.      Referring physician: Diona Foley, MD [address] [place],  [place] [address]  HISTORICAL INFORMATION:   Selected notes from the MEDICAL RECORD NUMBER       CURRENT MEDICATIONS: No current outpatient medications on file. (Ophthalmic Drugs)   No current facility-administered medications for this visit. (Ophthalmic Drugs)   Current Outpatient Medications (Other)  Medication Sig   doxycycline (VIBRA-TABS) 100 MG tablet Take 1 tablet (100 mg total) by mouth 2 (two) times daily.   insulin lispro protamine-lispro (HUMALOG 50/50 MIX) (50-50) 100 UNIT/ML SUSP injection Inject 50 Units into the skin 2 (two) times daily before a meal.   JARDIANCE 25 MG TABS tablet Take 25 mg by mouth daily.   levothyroxine (SYNTHROID) 200 MCG tablet Take 200 mcg by mouth every morning.   rosuvastatin (CRESTOR) 5 MG tablet Take 5 mg by mouth daily.   No current facility-administered medications for this visit. (Other)      REVIEW OF SYSTEMS: ROS   Negative for: Constitutional, Gastrointestinal, Neurological, Skin, Genitourinary, Musculoskeletal, HENT, Endocrine, Cardiovascular, Eyes, Respiratory, Psychiatric, Allergic/Imm, Heme/Lymph Last edited by Edmon Crape, MD on 04/02/2022   8:25 AM.       ALLERGIES Allergies  Allergen Reactions   Penicillins Rash    PAST MEDICAL HISTORY Past Medical History:  Diagnosis Date   Diabetes mellitus without complication (HCC)    Hyperlipidemia    Hypothyroidism 11/08/2020   Kidney stones    Past Surgical History:  Procedure Laterality Date   CHOLECYSTECTOMY     LITHOTRIPSY     NECK SURGERY      FAMILY HISTORY History reviewed. No pertinent family history.  SOCIAL HISTORY Social History   Tobacco Use   Smoking status: Never   Smokeless tobacco: Never  Substance Use Topics   Alcohol use: Yes   Drug use: No         OPHTHALMIC EXAM:  Base Eye Exam     Visual Acuity (ETDRS)       Right Left   Dist Hominy 20/20 20/25    Correction: Contacts  Contact lens OD.        Tonometry (Tonopen, 8:08 AM)       Right Left   Pressure 13 11         Pupils       Pupils Dark Light Shape APD   Right PERRL 5 4 Round None   Left PERRL 5 4 Round None         Visual Fields (Counting fingers)  Left Right    Full Full         Extraocular Movement       Right Left    Full Full         Neuro/Psych     Oriented x3: Yes   Mood/Affect: Normal         Dilation     Both eyes: 1.0% Mydriacyl, 2.5% Phenylephrine @ 8:08 AM           Slit Lamp and Fundus Exam     External Exam       Right Left   External Normal Normal         Slit Lamp Exam       Right Left   Lids/Lashes Normal Normal   Conjunctiva/Sclera White and quiet White and quiet   Cornea Clear Clear   Anterior Chamber Deep and quiet Deep and quiet   Iris Round and reactive Round and reactive   Lens 2+ Nuclear sclerosis, 1+ Posterior subcapsular cataract Centered posterior chamber intraocular lens, Open posterior capsule   Anterior Vitreous Normal Normal         Fundus Exam       Right Left   Posterior Vitreous Posterior vitreous detachment Normal, clear avitric   Disc Normal Normal   C/D Ratio 0.1 0.3    Macula Normal Normal   Vessels Normal,, no DR Normal,, no DR   Periphery Normal, no holes or tears,25 d and 20 d lens [place],  attached 360,, chorioretinal scarring laser induce a attached temporally inferiorly            IMAGING AND PROCEDURES  Imaging and Procedures for 04/02/22  Color Fundus Photography Optos - OU - Both Eyes       Right Eye Progression has no prior data. Disc findings include normal observations. Macula : normal observations. Vessels : normal observations. Periphery : normal observations.   Left Eye Progression has no prior data. Disc findings include normal observations. Macula : normal observations. Vessels : normal observations. Periphery : normal observations.   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  Under the care and evaluation of Dr. Zenaida NieceVan in direction of Dr. 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     ICD-10-CM   1. Traction detachment of left retina  H33.42 Color Fundus Photography Optos - OU - Both Eyes    2. Nuclear sclerotic cataract of right eye  H25.11     3. Diabetes mellitus without complication (HCC)  E11.9     4. Posterior vitreous detachment of right eye  H43.811     5. 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  OD no signs of hole or tear with the phakic condition.  2.  OS  history of retinal tear detachment, doing very well on the pseudophakic condition.  3.  OD likely has  image disparity.  He is working on myopic correction and/or reading glasses to correct this but has the option of cataract surgery lens implantation and discussion of potential correction at that point to an emmetropic state, under the direction of Dr. Zenaida Niece  Ophthalmic Meds Ordered this visit:  No orders of the defined types were placed in this encounter.      Return in about 1 year (around 04/03/2023) for DILATE OU, COLOR FP, OCT.  There are no Patient Instructions on file for this visit.   Explained the diagnoses, plan, and follow up with the patient and they expressed understanding.  Patient expressed understanding of the importance of proper follow up care.   Alford Highland Encarnacion Scioneaux M.D. Diseases & Surgery of the Retina and Vitreous [facility] 04/02/22     Abbreviations: M myopia (nearsighted); A astigmatism; H hyperopia (farsighted); P presbyopia; Mrx spectacle prescription;  CTL contact lenses; OD right eye; OS left eye; OU both eyes  XT exotropia; ET esotropia; PEK punctate epithelial keratitis; PEE punctate epithelial erosions; DES dry eye syndrome; MGD meibomian gland dysfunction; ATs artificial tears; PFAT's preservative free artificial tears; NSC nuclear sclerotic cataract; PSC posterior subcapsular cataract; ERM epi-retinal membrane; PVD posterior vitreous detachment; RD retinal detachment; DM diabetes mellitus; DR diabetic retinopathy; NPDR non-proliferative diabetic retinopathy; PDR proliferative diabetic retinopathy; CSME clinically significant macular edema; DME diabetic macular edema; dbh dot blot hemorrhages; CWS cotton wool spot; POAG primary open angle glaucoma; C/D cup-to-disc ratio; HVF humphrey visual field; GVF goldmann visual field; OCT optical coherence tomography; IOP intraocular pressure; BRVO Branch retinal vein occlusion; CRVO central retinal vein  occlusion; CRAO central retinal artery occlusion; BRAO branch retinal artery occlusion; RT retinal tear; SB scleral buckle; PPV pars plana vitrectomy; VH Vitreous hemorrhage; PRP panretinal laser photocoagulation; IVK intravitreal kenalog; VMT vitreomacular traction; MH Macular hole;  NVD neovascularization of the disc; NVE neovascularization elsewhere; AREDS age related eye disease study; ARMD age related macular degeneration; POAG primary open angle glaucoma; EBMD epithelial/anterior basement membrane dystrophy; ACIOL anterior chamber intraocular lens; IOL intraocular lens; PCIOL posterior chamber intraocular lens; Phaco/IOL phacoemulsification with intraocular lens placement; PRK photorefractive keratectomy; LASIK laser assisted in situ keratomileusis; HTN hypertension; DM diabetes mellitus; COPD chronic obstructive pulmonary disease

## 2022-04-02 NOTE — Assessment & Plan Note (Signed)
Stable OD, no signs of holes or tears

## 2022-04-02 NOTE — Assessment & Plan Note (Signed)
No detectable diabetic retinopathy 

## 2023-04-03 ENCOUNTER — Encounter (INDEPENDENT_AMBULATORY_CARE_PROVIDER_SITE_OTHER): Payer: Self-pay

## 2023-04-03 ENCOUNTER — Encounter (INDEPENDENT_AMBULATORY_CARE_PROVIDER_SITE_OTHER): Payer: 59 | Admitting: Ophthalmology

## 2023-09-12 ENCOUNTER — Ambulatory Visit (INDEPENDENT_AMBULATORY_CARE_PROVIDER_SITE_OTHER): Payer: BC Managed Care – PPO

## 2023-09-12 ENCOUNTER — Ambulatory Visit: Payer: BC Managed Care – PPO | Admitting: Podiatry

## 2023-09-12 ENCOUNTER — Encounter: Payer: Self-pay | Admitting: Podiatry

## 2023-09-12 DIAGNOSIS — M87074 Idiopathic aseptic necrosis of right foot: Secondary | ICD-10-CM

## 2023-09-12 DIAGNOSIS — M7751 Other enthesopathy of right foot: Secondary | ICD-10-CM

## 2023-09-12 NOTE — Progress Notes (Signed)
He presents after having not seen him since February of last year.  He presents with a concern of his right Lapidus procedure resulting in a cocked up hallux and an increase in pain.  He states that the toe has been increasing in its angular deformity and appears to be shortening.  He denies any trauma he denies fever chills nausea and vomiting he denies a red hot swollen painful joint.  Objective: Vital signs are stable he is alert and oriented x 3.  Pulses are palpable.  There is no erythema edema cellulitis drainage or odor.  Left foot is gone on to heal very nicely from his Lapidus procedure however the contralateral foot demonstrates a well-healed surgical site however.  The hallux is extended and has shortened and has good dorsiflexion with crepitus and minimal plantarflexion.  There is no erythema edema salines drainage odor associated with this it is not warm to the touch does not appear to be metabolically active however radiographs taken today do demonstrate an osseously mature individual with a well-healed Lapidus procedure does not appear to have any elevatus of the first metatarsal on this view however he does have a cocked up deformity at the level of the metatarsal phalangeal joint with compression or necrosis of the head of the first metatarsal.  Questioning here whether this is a chronic osteo or a possible avascular necrosis.  Assessment: Severe digital deformity hallux first metatarsophalangeal joint secondary to possible osteo myelitis or osteonecrosis or avascular necrosis.  Plan: We are going to perform an arthritic panel as well as a comprehensive metabolic panel as well as a request for an MRI to evaluate for disease state differential diagnosis and surgical intervention.

## 2023-09-13 LAB — CBC WITH DIFFERENTIAL/PLATELET
Absolute Lymphocytes: 1320 {cells}/uL (ref 850–3900)
Absolute Monocytes: 418 {cells}/uL (ref 200–950)
Basophils Absolute: 28 {cells}/uL (ref 0–200)
Basophils Relative: 0.5 %
Eosinophils Absolute: 61 {cells}/uL (ref 15–500)
Eosinophils Relative: 1.1 %
HCT: 50 % (ref 38.5–50.0)
Hemoglobin: 16 g/dL (ref 13.2–17.1)
MCH: 29.4 pg (ref 27.0–33.0)
MCHC: 32 g/dL (ref 32.0–36.0)
MCV: 91.9 fL (ref 80.0–100.0)
MPV: 10.4 fL (ref 7.5–12.5)
Monocytes Relative: 7.6 %
Neutro Abs: 3674 {cells}/uL (ref 1500–7800)
Neutrophils Relative %: 66.8 %
Platelets: 226 10*3/uL (ref 140–400)
RBC: 5.44 10*6/uL (ref 4.20–5.80)
RDW: 12.4 % (ref 11.0–15.0)
Total Lymphocyte: 24 %
WBC: 5.5 10*3/uL (ref 3.8–10.8)

## 2023-09-13 LAB — URIC ACID: Uric Acid, Serum: 4.5 mg/dL (ref 4.0–8.0)

## 2023-09-13 LAB — C-REACTIVE PROTEIN: CRP: 7.9 mg/L (ref <8.0)

## 2023-09-13 LAB — ANA: Anti Nuclear Antibody (ANA): NEGATIVE

## 2023-09-13 LAB — RHEUMATOID FACTOR: Rheumatoid fact SerPl-aCnc: <10 [IU]/mL (ref <14)

## 2023-09-13 LAB — SEDIMENTATION RATE: Sed Rate: 9 mm/h (ref 0–20)

## 2023-09-19 ENCOUNTER — Ambulatory Visit
Admission: RE | Admit: 2023-09-19 | Discharge: 2023-09-19 | Disposition: A | Discharge status: Home / Self Care | Payer: BLUE CROSS/BLUE SHIELD | Source: Ambulatory Visit | Attending: Podiatry | Admitting: Podiatry

## 2023-09-19 DIAGNOSIS — M7751 Other enthesopathy of right foot: Secondary | ICD-10-CM

## 2023-09-19 DIAGNOSIS — M19071 Primary osteoarthritis, right ankle and foot: Secondary | ICD-10-CM | POA: Diagnosis not present

## 2023-09-19 DIAGNOSIS — M87074 Idiopathic aseptic necrosis of right foot: Secondary | ICD-10-CM

## 2023-09-28 ENCOUNTER — Other Ambulatory Visit: Payer: 59

## 2023-09-30 ENCOUNTER — Telehealth: Payer: Self-pay | Admitting: Podiatry

## 2023-09-30 NOTE — Telephone Encounter (Signed)
Pt called checking ot see if we have heard anything about his mri results and next steps  I checked and it does not show released yet. I will see if the nurse triage can push it along.   He is having a lot of foot pain and can hardly walk on foot. I scheduled him to see Dr Al Corpus 10/08/23( next Tuesday)

## 2023-10-02 ENCOUNTER — Other Ambulatory Visit: Payer: 59

## 2023-10-08 ENCOUNTER — Encounter: Payer: Self-pay | Admitting: Podiatry

## 2023-10-08 ENCOUNTER — Ambulatory Visit: Payer: BC Managed Care – PPO | Admitting: Podiatry

## 2023-10-08 VITALS — Ht 74.0 in | Wt 260.0 lb

## 2023-10-08 DIAGNOSIS — M19071 Primary osteoarthritis, right ankle and foot: Secondary | ICD-10-CM | POA: Diagnosis not present

## 2023-10-08 DIAGNOSIS — M8970 Major osseous defect, unspecified site: Secondary | ICD-10-CM | POA: Diagnosis not present

## 2023-10-08 NOTE — Progress Notes (Signed)
Subjective:  Patient ID: Logan Gutierrez, male    DOB: 1960-12-15,  MRN: [id number]  Chief Complaint  Patient presents with   Results    RM4 mri results/ next steps/ hard to walk on foot    Discussed the use of AI scribe software for clinical note transcription with the patient, who gave verbal consent to proceed.  History of Present Illness   The patient, with a history of foot surgery, presents for a follow-up visit to discuss the results of an MRI scan. He reports persistent swelling and discomfort in his big toe, which has started to stick up and not lay flat. This issue began approximately four to six months ago, and the patient sought medical attention about a month ago with Dr Al Corpus who did his prior Lapidus surgery. He reports that surgery healed well, did have some persistent swelling after surgery but no complications of wound healing were noted.  The patient denies any history of gout and reports no issues with the healing of his surgical incision. He also has a history of diabetes, which is managed to keep his A1c levels between 7 and 7.5.          Objective:    Physical Exam   EXTREMITIES: Foot warm and well perfused. Palpable dorsalis pedis and posterior tibial pulse. Good capillary refill time. MUSCULOSKELETAL: Hallux in rigid extensile dorsiflexion contracture. Pain and edema around the first MTP joint. SKIN: Well-healed surgical scar from previous surgery. No signs of infection or cellulitis.       No images are attached to the encounter.    Results   LABS A1c: 7-7.5%  RADIOLOGY MRI: Severe osteoarthritis with subchondral bone cyst and collapse of the metatarsophalangeal joint on the metatarsal and phalangeal side with bone cyst formation. No clear evidence of osteonecrosis.   Xray of right foot: collapse of 1st MTP joint      Assessment:   1. Osteoarthritis of first metatarsophalangeal (MTP) joint of right foot   2. Major bone defects      Plan:   Patient was evaluated and treated and all questions answered.  Assessment and Plan    End-stage Arthritis of First Metatarsophalangeal (MTP) Joint   He exhibits hallux in extensis contracture and loss of length of the first ray, with MRI revealing severe osteoarthritis, subchondral bone cyst, and collapse of the metatarsal phalangeal joint on both the metatarsal and phalangeal sides, alongside bone cyst formation but no clear evidence of osteonecrosis. We will plan for surgical treatment through arthrodesis of the first MTP joint, using either allograft or autograft from the iliac crest. Coordination with orthopedic surgery may be necessary for the graft from the iliac crest, and surgery will be scheduled on a future date.  Diabetes   His A1C levels are between 7 and 7.5. We advise keeping A1C as close to 7 as possible to reduce the risk of infection post-surgery and recommend continued management with an endocrinologist.     All risks, benefits and potential complications discussed including but not limited to pain, swelling, infection, scar, numbness which may be temporary or permanent, chronic pain, stiffness, nerve pain or damage, wound healing problems, bone healing problems including delayed or non-union. All questions addressed. Informed consent signed and reviewed.      Surgical plan:  Procedure: -Right first metatarsal phalangeal joint arthrodesis with allograft or autograft  Location: -[facility]  Anesthesia plan: -General With regional block  Postoperative pain plan: - Tylenol 1000 mg every 6 hours, gabapentin 300  mg every 8 hours x5 days, oxycodone 5 mg 1-2 tabs every 6 hours only as needed  DVT prophylaxis: -Xarelto 10 mg nightly  WB Restrictions / DME needs: -NWB in splint postop for 6 weeks    No follow-ups on file.

## 2023-10-09 DIAGNOSIS — E7800 Pure hypercholesterolemia, unspecified: Secondary | ICD-10-CM | POA: Diagnosis not present

## 2023-10-09 DIAGNOSIS — M064 Inflammatory polyarthropathy: Secondary | ICD-10-CM | POA: Diagnosis not present

## 2023-10-09 DIAGNOSIS — E1165 Type 2 diabetes mellitus with hyperglycemia: Secondary | ICD-10-CM | POA: Diagnosis not present

## 2023-10-09 DIAGNOSIS — Z23 Encounter for immunization: Secondary | ICD-10-CM | POA: Diagnosis not present

## 2023-10-09 DIAGNOSIS — E039 Hypothyroidism, unspecified: Secondary | ICD-10-CM | POA: Diagnosis not present

## 2023-10-15 ENCOUNTER — Ambulatory Visit: Payer: Self-pay | Admitting: Podiatry

## 2023-10-16 ENCOUNTER — Telehealth: Payer: Self-pay | Admitting: Podiatry

## 2023-10-16 NOTE — Telephone Encounter (Signed)
DOS- 11/22/23  HALLUX MPJ FUSION [id number] BONE GRAFT RT-[id number]  [employer] EFFECTIVE DATE- 07/30/2023  DEDUCTIBLE- $9450.00 WITH REMAINING $9450.00 OOP-$9450.00 WITH REMAINING $8231.63 COINSURANCE- 0%  SPOKE WITH CHARLIE L. FROM [employer] AND HE STATED THAT PRIOR AUTH IS NOT REQUIRED FOR CPT CODES 45409 AND 20900.  CALL REFERENCE NUMBER: I - [id number]

## 2023-11-21 ENCOUNTER — Other Ambulatory Visit: Payer: Self-pay | Admitting: Podiatry

## 2023-11-21 DIAGNOSIS — E1165 Type 2 diabetes mellitus with hyperglycemia: Secondary | ICD-10-CM | POA: Diagnosis not present

## 2023-11-21 DIAGNOSIS — E7800 Pure hypercholesterolemia, unspecified: Secondary | ICD-10-CM | POA: Diagnosis not present

## 2023-11-21 DIAGNOSIS — E039 Hypothyroidism, unspecified: Secondary | ICD-10-CM | POA: Diagnosis not present

## 2023-11-21 MED ORDER — OXYCODONE HCL 5 MG PO TABS: 5.0000 mg | ORAL_TABLET | Freq: Four times a day (QID) | ORAL | 0 refills | Status: AC | PRN

## 2023-11-21 MED ORDER — ASPIRIN 325 MG PO TBEC: 325.0000 mg | DELAYED_RELEASE_TABLET | Freq: Two times a day (BID) | ORAL | 0 refills | Status: DC

## 2023-11-21 MED ORDER — GABAPENTIN 300 MG PO CAPS: 300.0000 mg | ORAL_CAPSULE | Freq: Three times a day (TID) | ORAL | 0 refills | Status: DC

## 2023-11-21 MED ORDER — ACETAMINOPHEN 500 MG PO TABS: 1000.0000 mg | ORAL_TABLET | Freq: Four times a day (QID) | ORAL | 0 refills | Status: AC | PRN

## 2023-11-21 MED ORDER — IBUPROFEN 600 MG PO TABS: 600.0000 mg | ORAL_TABLET | Freq: Three times a day (TID) | ORAL | 0 refills | Status: AC | PRN

## 2023-11-21 NOTE — Progress Notes (Signed)
1/24 MTP fusion with bone graft

## 2023-11-22 DIAGNOSIS — G8918 Other acute postprocedural pain: Secondary | ICD-10-CM | POA: Diagnosis not present

## 2023-11-22 DIAGNOSIS — M89771 Major osseous defect, right ankle and foot: Secondary | ICD-10-CM | POA: Diagnosis not present

## 2023-11-22 DIAGNOSIS — M898X7 Other specified disorders of bone, ankle and foot: Secondary | ICD-10-CM | POA: Diagnosis not present

## 2023-11-22 DIAGNOSIS — M19071 Primary osteoarthritis, right ankle and foot: Secondary | ICD-10-CM | POA: Diagnosis not present

## 2023-11-28 ENCOUNTER — Encounter: Payer: Self-pay | Admitting: Podiatry

## 2023-11-28 ENCOUNTER — Ambulatory Visit (INDEPENDENT_AMBULATORY_CARE_PROVIDER_SITE_OTHER): Payer: BC Managed Care – PPO | Admitting: Podiatry

## 2023-11-28 ENCOUNTER — Ambulatory Visit (INDEPENDENT_AMBULATORY_CARE_PROVIDER_SITE_OTHER): Payer: BC Managed Care – PPO

## 2023-11-28 VITALS — Ht 74.0 in | Wt 260.0 lb

## 2023-11-28 DIAGNOSIS — M19071 Primary osteoarthritis, right ankle and foot: Secondary | ICD-10-CM | POA: Diagnosis not present

## 2023-11-29 DIAGNOSIS — E039 Hypothyroidism, unspecified: Secondary | ICD-10-CM | POA: Diagnosis not present

## 2023-11-29 DIAGNOSIS — M064 Inflammatory polyarthropathy: Secondary | ICD-10-CM | POA: Diagnosis not present

## 2023-11-29 DIAGNOSIS — M0609 Rheumatoid arthritis without rheumatoid factor, multiple sites: Secondary | ICD-10-CM | POA: Diagnosis not present

## 2023-11-29 DIAGNOSIS — E1165 Type 2 diabetes mellitus with hyperglycemia: Secondary | ICD-10-CM | POA: Diagnosis not present

## 2023-11-29 NOTE — Progress Notes (Signed)
  Subjective:  Patient ID: Logan Gutierrez, male    DOB: Feb 08, 1961,  MRN: [id number]  Chief Complaint  Patient presents with   Routine Post Op    POV #1, DOS 11/22/23, FUSION OF GREAT TOE JOINT WITH BONE GRAFT FROM CADAVER/HEEL/HIP      63 y.o. male returns for post-op check.  He is doing well  Review of Systems: Negative except as noted in the HPI. Denies N/V/F/Ch.   Objective:  There were no vitals filed for this visit. Body mass index is 33.38 kg/m. Constitutional Well developed. Well nourished.  Vascular Foot warm and well perfused. Capillary refill normal to all digits.  Calf is soft and supple, no posterior calf or knee pain, negative Homans' sign  Neurologic Normal speech. Oriented to person, place, and time. Epicritic sensation to light touch grossly present bilaterally.  Dermatologic Skin healing well without signs of infection. Skin edges well coapted without signs of infection.  Orthopedic: Tenderness to palpation noted about the surgical site.   Multiple view plain film radiographs: Good correction noted graft and hardware intact and in position Assessment:   1. Osteoarthritis of first metatarsophalangeal (MTP) joint of right foot    Plan:  Patient was evaluated and treated and all questions answered.  S/p foot surgery right -Progressing as expected post-operatively. -XR: noted above no complications -WB Status: NWB in CAM boot -Sutures: Return in 2 weeks to remove. -Medications: No refills required -Foot redressed.  No follow-ups on file.

## 2023-12-12 ENCOUNTER — Encounter: Payer: Self-pay | Admitting: Podiatry

## 2023-12-12 ENCOUNTER — Ambulatory Visit (INDEPENDENT_AMBULATORY_CARE_PROVIDER_SITE_OTHER): Payer: BC Managed Care – PPO | Admitting: Podiatry

## 2023-12-12 DIAGNOSIS — M19071 Primary osteoarthritis, right ankle and foot: Secondary | ICD-10-CM

## 2023-12-12 NOTE — Progress Notes (Signed)
  Subjective:  Patient ID: Logan Gutierrez, male    DOB: [birth year],  MRN: [id number]  Chief Complaint  Patient presents with   Routine Post Op    DOS 11/22/23, FUSION OF GREAT TOE JOINT WITH BONE GRAFT FROM CADAVER/HEEL/HIP Pt stated that he is doing well he denies pain at this time. No major concerns.       63 y.o. male returns for post-op check.  He is doing well  Review of Systems: Negative except as noted in the HPI. Denies N/V/F/Ch.   Objective:  There were no vitals filed for this visit. There is no height or weight on file to calculate BMI. Constitutional Well developed. Well nourished.  Vascular Foot warm and well perfused. Capillary refill normal to all digits.  Calf is soft and supple, no posterior calf or knee pain, negative Homans' sign  Neurologic Normal speech. Oriented to person, place, and time. Epicritic sensation to light touch grossly present bilaterally.  Dermatologic Incision well healed not hypertrophic  Orthopedic: Minimal pain to palpation noted about the surgical site.  Edema improving   Multiple view plain film radiographs: Good correction noted graft and hardware intact and in position Assessment:   1. Osteoarthritis of first metatarsophalangeal (MTP) joint of right foot    Plan:  Patient was evaluated and treated and all questions answered.  S/p foot surgery right -Sutures removed uneventfully can begin gradual weightbearing only to the heel otherwise use knee scooter.  Return in 3 weeks for new x-rays and hopefully transition to full weightbearing then.  Return in about 3 weeks (around 01/02/2024) for post op (new x-rays).

## 2023-12-19 DIAGNOSIS — H2511 Age-related nuclear cataract, right eye: Secondary | ICD-10-CM | POA: Diagnosis not present

## 2023-12-19 DIAGNOSIS — H31092 Other chorioretinal scars, left eye: Secondary | ICD-10-CM | POA: Diagnosis not present

## 2023-12-19 DIAGNOSIS — Z961 Presence of intraocular lens: Secondary | ICD-10-CM | POA: Diagnosis not present

## 2023-12-23 ENCOUNTER — Encounter: Payer: Self-pay | Admitting: Podiatry

## 2023-12-31 ENCOUNTER — Ambulatory Visit (INDEPENDENT_AMBULATORY_CARE_PROVIDER_SITE_OTHER): Payer: PRIVATE HEALTH INSURANCE

## 2023-12-31 ENCOUNTER — Ambulatory Visit (INDEPENDENT_AMBULATORY_CARE_PROVIDER_SITE_OTHER): Payer: BLUE CROSS/BLUE SHIELD | Admitting: Podiatry

## 2023-12-31 ENCOUNTER — Encounter: Payer: Self-pay | Admitting: Podiatry

## 2023-12-31 DIAGNOSIS — M19071 Primary osteoarthritis, right ankle and foot: Secondary | ICD-10-CM | POA: Diagnosis not present

## 2023-12-31 NOTE — Progress Notes (Signed)
  Subjective:  Patient ID: Logan Gutierrez, male    DOB: 08/15/1961,  MRN: [id number]  Chief Complaint  Patient presents with   Routine Post Op    Patient states everything has been real good since last visit, it is getting better everyday.      63 y.o. male returns for post-op check.  He reports minimal pain he has been wearing a sneaker at home he says and presents today in this  Review of Systems: Negative except as noted in the HPI. Denies N/V/F/Ch.   Objective:  There were no vitals filed for this visit. There is no height or weight on file to calculate BMI. Constitutional Well developed. Well nourished.  Vascular Foot warm and well perfused. Capillary refill normal to all digits.  Calf is soft and supple, no posterior calf or knee pain, negative Homans' sign  Neurologic Normal speech. Oriented to person, place, and time. Epicritic sensation to light touch grossly present bilaterally.  Dermatologic Incision well healed not hypertrophic  Orthopedic: Minimal pain to palpation noted about the surgical site.  Edema improving   Multiple view plain film radiographs: Good correction noted graft and hardware intact and in position no change since last films there is some increasing consolidation across the graft but still has decreased bone density compared to his normal bone Assessment:   1. Osteoarthritis of first metatarsophalangeal (MTP) joint of right foot    Plan:  Patient was evaluated and treated and all questions answered.  S/p foot surgery right -We reviewed his radiographs I discussed with him that I would continue to weight-bear only in the cam boot for another 2 to 3 weeks and then can transition to full weightbearing in shoe gear full-time to allow for further consolidation across his graft.  I will see him back in 4 weeks for new x-rays  No follow-ups on file.

## 2024-01-02 ENCOUNTER — Encounter: Payer: BLUE CROSS/BLUE SHIELD | Admitting: Podiatry

## 2024-01-28 ENCOUNTER — Ambulatory Visit (INDEPENDENT_AMBULATORY_CARE_PROVIDER_SITE_OTHER): Payer: PRIVATE HEALTH INSURANCE | Admitting: Podiatry

## 2024-01-28 ENCOUNTER — Ambulatory Visit (INDEPENDENT_AMBULATORY_CARE_PROVIDER_SITE_OTHER)

## 2024-01-28 ENCOUNTER — Encounter: Payer: Self-pay | Admitting: Podiatry

## 2024-01-28 DIAGNOSIS — M19071 Primary osteoarthritis, right ankle and foot: Secondary | ICD-10-CM

## 2024-01-28 NOTE — Progress Notes (Signed)
  Subjective:  Patient ID: Logan Gutierrez, male    DOB: 10/29/1961,  MRN: [id number]  Chief Complaint  Patient presents with   Routine Post Op    Patient states that he has not had any pain, no discomfort. Everything has been going well since last visit      63 y.o. male returns for post-op check.  He is doing well having minimal pain  Review of Systems: Negative except as noted in the HPI. Denies N/V/F/Ch.   Objective:  There were no vitals filed for this visit. There is no height or weight on file to calculate BMI. Constitutional Well developed. Well nourished.  Vascular Foot warm and well perfused. Capillary refill normal to all digits.  Calf is soft and supple, no posterior calf or knee pain, negative Homans' sign  Neurologic Normal speech. Oriented to person, place, and time. Epicritic sensation to light touch grossly present bilaterally.  Dermatologic Incision well healed not hypertrophic  Orthopedic: Minimal pain to palpation noted about the surgical site.  Little to no edema   Multiple view plain film radiographs: Good correction noted graft and hardware intact and in position, there is increased creeping substitution noted with good mineralization of the graft Assessment:   1. Osteoarthritis of first metatarsophalangeal (MTP) joint of right foot    Plan:  Patient was evaluated and treated and all questions answered.  S/p foot surgery right -Doing well may continue supportive shoes.  Low impact activity including golf is okay at this point but avoid impact activity.  Return in 6 weeks for new radiographs.  Return in about 6 weeks (around 03/10/2024) for surgery follow up (new xrays).

## 2024-02-11 DIAGNOSIS — M064 Inflammatory polyarthropathy: Secondary | ICD-10-CM | POA: Diagnosis not present

## 2024-02-11 DIAGNOSIS — E7800 Pure hypercholesterolemia, unspecified: Secondary | ICD-10-CM | POA: Diagnosis not present

## 2024-02-11 DIAGNOSIS — E1165 Type 2 diabetes mellitus with hyperglycemia: Secondary | ICD-10-CM | POA: Diagnosis not present

## 2024-02-11 DIAGNOSIS — E039 Hypothyroidism, unspecified: Secondary | ICD-10-CM | POA: Diagnosis not present

## 2024-03-30 ENCOUNTER — Ambulatory Visit (INDEPENDENT_AMBULATORY_CARE_PROVIDER_SITE_OTHER): Admitting: Podiatry

## 2024-03-30 ENCOUNTER — Ambulatory Visit (INDEPENDENT_AMBULATORY_CARE_PROVIDER_SITE_OTHER)

## 2024-03-30 ENCOUNTER — Encounter: Payer: Self-pay | Admitting: Podiatry

## 2024-03-30 DIAGNOSIS — M19071 Primary osteoarthritis, right ankle and foot: Secondary | ICD-10-CM

## 2024-03-30 NOTE — Progress Notes (Signed)
  Subjective:  Patient ID: Logan Gutierrez, male    DOB: Jan 14, 1961,  MRN: [id number]  Chief Complaint  Patient presents with   Routine Post Op    POV #5, DOS 11/22/23, FUSION OF GREAT TOE JOINT WITH BONE GRAFT FROM CADAVER/HEEL/HIP. 2 pain.      63 y.o. male returns for post-op check.  He is doing well having minimal pain  Review of Systems: Negative except as noted in the HPI. Denies N/V/F/Ch.   Objective:  There were no vitals filed for this visit. There is no height or weight on file to calculate BMI. Constitutional Well developed. Well nourished.  Vascular Foot warm and well perfused. Capillary refill normal to all digits.  Calf is soft and supple, no posterior calf or knee pain, negative Homans' sign  Neurologic Normal speech. Oriented to person, place, and time. Epicritic sensation to light touch grossly present bilaterally.  Dermatologic Incision well healed not hypertrophic  Orthopedic: Minimal pain to palpation noted about the surgical site.  Little to no edema   Multiple view plain film radiographs: Increasing mineralization and creeping substitution noted Assessment:   1. Osteoarthritis of first metatarsophalangeal (MTP) joint of right foot    Plan:  Patient was evaluated and treated and all questions answered.  S/p foot surgery right - Overall doing quite well may continue regular shoe gear and activity I discussed with him that with the size of his graft full integration is going to take quite some time which I am seeing gradual interval progress on his radiographs.  May take a full year for symptoms to fully resolve.  Notes he is having a couple of balance issues and will take time to adjust to his double arthrodesis.  Discussed with him that if he notes continued issues with this physical therapy may be beneficial and he will notify me if this is necessary.  Follow-up as needed if he  No follow-ups on file.

## 2024-04-02 DIAGNOSIS — E119 Type 2 diabetes mellitus without complications: Secondary | ICD-10-CM | POA: Diagnosis not present

## 2024-04-02 DIAGNOSIS — H43821 Vitreomacular adhesion, right eye: Secondary | ICD-10-CM | POA: Diagnosis not present

## 2024-04-02 DIAGNOSIS — H2511 Age-related nuclear cataract, right eye: Secondary | ICD-10-CM | POA: Diagnosis not present

## 2024-07-14 DIAGNOSIS — E039 Hypothyroidism, unspecified: Secondary | ICD-10-CM | POA: Diagnosis not present

## 2024-07-14 DIAGNOSIS — E7800 Pure hypercholesterolemia, unspecified: Secondary | ICD-10-CM | POA: Diagnosis not present

## 2024-07-14 DIAGNOSIS — E1165 Type 2 diabetes mellitus with hyperglycemia: Secondary | ICD-10-CM | POA: Diagnosis not present

## 2024-07-14 DIAGNOSIS — E162 Hypoglycemia, unspecified: Secondary | ICD-10-CM | POA: Diagnosis not present
